# Patient Record
Sex: Female | Born: 1940 | Race: White | Hispanic: No | Marital: Married | Smoking: Former smoker
Health system: Southern US, Community
[De-identification: ages and names within clinical notes are randomized; demographics above are authoritative.]

## PROBLEM LIST (undated history)

## (undated) DIAGNOSIS — H269 Unspecified cataract: Secondary | ICD-10-CM

## (undated) DIAGNOSIS — M1990 Unspecified osteoarthritis, unspecified site: Secondary | ICD-10-CM

## (undated) DIAGNOSIS — K219 Gastro-esophageal reflux disease without esophagitis: Secondary | ICD-10-CM

## (undated) DIAGNOSIS — Z973 Presence of spectacles and contact lenses: Secondary | ICD-10-CM

## (undated) DIAGNOSIS — I219 Acute myocardial infarction, unspecified: Secondary | ICD-10-CM

## (undated) DIAGNOSIS — I10 Essential (primary) hypertension: Secondary | ICD-10-CM

## (undated) DIAGNOSIS — E119 Type 2 diabetes mellitus without complications: Secondary | ICD-10-CM

## (undated) DIAGNOSIS — R351 Nocturia: Secondary | ICD-10-CM

## (undated) DIAGNOSIS — C801 Malignant (primary) neoplasm, unspecified: Secondary | ICD-10-CM

## (undated) DIAGNOSIS — D649 Anemia, unspecified: Secondary | ICD-10-CM

## (undated) DIAGNOSIS — T8859XA Other complications of anesthesia, initial encounter: Secondary | ICD-10-CM

## (undated) DIAGNOSIS — T4145XA Adverse effect of unspecified anesthetic, initial encounter: Secondary | ICD-10-CM

## (undated) DIAGNOSIS — E7800 Pure hypercholesterolemia, unspecified: Secondary | ICD-10-CM

## (undated) DIAGNOSIS — Z972 Presence of dental prosthetic device (complete) (partial): Secondary | ICD-10-CM

## (undated) HISTORY — PX: ABDOMINAL HYSTERECTOMY: SHX81

## (undated) HISTORY — PX: TUBAL LIGATION: SHX77

## (undated) HISTORY — PX: MELANOMA EXCISION: SHX5266

## (undated) HISTORY — PX: DILATION AND CURETTAGE OF UTERUS: SHX78

## (undated) HISTORY — PX: TONSILLECTOMY: SUR1361

## (undated) HISTORY — PX: CORONARY STENT PLACEMENT: SHX1402

## (undated) HISTORY — PX: COLONOSCOPY: SHX174

## (undated) HISTORY — PX: MULTIPLE TOOTH EXTRACTIONS: SHX2053

## (undated) HISTORY — PX: APPENDECTOMY: SHX54

---

## 2014-04-11 DIAGNOSIS — Z85828 Personal history of other malignant neoplasm of skin: Secondary | ICD-10-CM | POA: Diagnosis not present

## 2014-04-11 DIAGNOSIS — L400 Psoriasis vulgaris: Secondary | ICD-10-CM | POA: Diagnosis not present

## 2014-04-12 DIAGNOSIS — I10 Essential (primary) hypertension: Secondary | ICD-10-CM | POA: Diagnosis not present

## 2014-04-12 DIAGNOSIS — M5416 Radiculopathy, lumbar region: Secondary | ICD-10-CM | POA: Diagnosis not present

## 2014-04-12 DIAGNOSIS — M4120 Other idiopathic scoliosis, site unspecified: Secondary | ICD-10-CM | POA: Diagnosis not present

## 2014-04-12 DIAGNOSIS — M5136 Other intervertebral disc degeneration, lumbar region: Secondary | ICD-10-CM | POA: Diagnosis not present

## 2014-04-12 DIAGNOSIS — M545 Low back pain: Secondary | ICD-10-CM | POA: Diagnosis not present

## 2014-04-12 DIAGNOSIS — Z6829 Body mass index (BMI) 29.0-29.9, adult: Secondary | ICD-10-CM | POA: Diagnosis not present

## 2014-04-14 DIAGNOSIS — Z1231 Encounter for screening mammogram for malignant neoplasm of breast: Secondary | ICD-10-CM | POA: Diagnosis not present

## 2014-04-15 DIAGNOSIS — M5136 Other intervertebral disc degeneration, lumbar region: Secondary | ICD-10-CM | POA: Diagnosis not present

## 2014-04-15 DIAGNOSIS — M4806 Spinal stenosis, lumbar region: Secondary | ICD-10-CM | POA: Diagnosis not present

## 2014-04-15 DIAGNOSIS — M5416 Radiculopathy, lumbar region: Secondary | ICD-10-CM | POA: Diagnosis not present

## 2014-04-15 DIAGNOSIS — M5126 Other intervertebral disc displacement, lumbar region: Secondary | ICD-10-CM | POA: Diagnosis not present

## 2014-04-26 DIAGNOSIS — M5416 Radiculopathy, lumbar region: Secondary | ICD-10-CM | POA: Diagnosis not present

## 2014-04-26 DIAGNOSIS — I10 Essential (primary) hypertension: Secondary | ICD-10-CM | POA: Diagnosis not present

## 2014-04-26 DIAGNOSIS — M4120 Other idiopathic scoliosis, site unspecified: Secondary | ICD-10-CM | POA: Diagnosis not present

## 2014-04-26 DIAGNOSIS — Z6830 Body mass index (BMI) 30.0-30.9, adult: Secondary | ICD-10-CM | POA: Diagnosis not present

## 2014-04-26 DIAGNOSIS — M5136 Other intervertebral disc degeneration, lumbar region: Secondary | ICD-10-CM | POA: Diagnosis not present

## 2014-04-26 DIAGNOSIS — M545 Low back pain: Secondary | ICD-10-CM | POA: Diagnosis not present

## 2014-05-04 DIAGNOSIS — M5416 Radiculopathy, lumbar region: Secondary | ICD-10-CM | POA: Diagnosis not present

## 2014-05-04 DIAGNOSIS — M5136 Other intervertebral disc degeneration, lumbar region: Secondary | ICD-10-CM | POA: Diagnosis not present

## 2014-05-04 DIAGNOSIS — M4120 Other idiopathic scoliosis, site unspecified: Secondary | ICD-10-CM | POA: Diagnosis not present

## 2014-05-10 DIAGNOSIS — L400 Psoriasis vulgaris: Secondary | ICD-10-CM | POA: Diagnosis not present

## 2014-05-10 DIAGNOSIS — Z85828 Personal history of other malignant neoplasm of skin: Secondary | ICD-10-CM | POA: Diagnosis not present

## 2014-05-17 DIAGNOSIS — Z955 Presence of coronary angioplasty implant and graft: Secondary | ICD-10-CM | POA: Diagnosis not present

## 2014-05-17 DIAGNOSIS — I2510 Atherosclerotic heart disease of native coronary artery without angina pectoris: Secondary | ICD-10-CM | POA: Diagnosis not present

## 2014-05-17 DIAGNOSIS — I10 Essential (primary) hypertension: Secondary | ICD-10-CM | POA: Diagnosis not present

## 2014-05-17 DIAGNOSIS — E785 Hyperlipidemia, unspecified: Secondary | ICD-10-CM | POA: Diagnosis not present

## 2014-06-01 DIAGNOSIS — M5416 Radiculopathy, lumbar region: Secondary | ICD-10-CM | POA: Diagnosis not present

## 2014-07-03 DIAGNOSIS — L570 Actinic keratosis: Secondary | ICD-10-CM | POA: Diagnosis not present

## 2014-07-03 DIAGNOSIS — D0372 Melanoma in situ of left lower limb, including hip: Secondary | ICD-10-CM | POA: Diagnosis not present

## 2014-07-03 DIAGNOSIS — Z85828 Personal history of other malignant neoplasm of skin: Secondary | ICD-10-CM | POA: Diagnosis not present

## 2014-07-03 DIAGNOSIS — D2372 Other benign neoplasm of skin of left lower limb, including hip: Secondary | ICD-10-CM | POA: Diagnosis not present

## 2014-07-03 DIAGNOSIS — L820 Inflamed seborrheic keratosis: Secondary | ICD-10-CM | POA: Diagnosis not present

## 2014-07-03 DIAGNOSIS — B078 Other viral warts: Secondary | ICD-10-CM | POA: Diagnosis not present

## 2014-07-03 DIAGNOSIS — L409 Psoriasis, unspecified: Secondary | ICD-10-CM | POA: Diagnosis not present

## 2014-08-01 DIAGNOSIS — E785 Hyperlipidemia, unspecified: Secondary | ICD-10-CM | POA: Diagnosis not present

## 2014-08-03 DIAGNOSIS — M545 Low back pain: Secondary | ICD-10-CM | POA: Diagnosis not present

## 2014-08-03 DIAGNOSIS — I10 Essential (primary) hypertension: Secondary | ICD-10-CM | POA: Diagnosis not present

## 2014-08-03 DIAGNOSIS — M5416 Radiculopathy, lumbar region: Secondary | ICD-10-CM | POA: Diagnosis not present

## 2014-08-03 DIAGNOSIS — M4120 Other idiopathic scoliosis, site unspecified: Secondary | ICD-10-CM | POA: Diagnosis not present

## 2014-08-03 DIAGNOSIS — Z6830 Body mass index (BMI) 30.0-30.9, adult: Secondary | ICD-10-CM | POA: Diagnosis not present

## 2014-08-03 DIAGNOSIS — M5136 Other intervertebral disc degeneration, lumbar region: Secondary | ICD-10-CM | POA: Diagnosis not present

## 2014-08-09 DIAGNOSIS — C4372 Malignant melanoma of left lower limb, including hip: Secondary | ICD-10-CM | POA: Diagnosis not present

## 2014-08-09 DIAGNOSIS — D0372 Melanoma in situ of left lower limb, including hip: Secondary | ICD-10-CM | POA: Diagnosis not present

## 2014-08-09 DIAGNOSIS — L905 Scar conditions and fibrosis of skin: Secondary | ICD-10-CM | POA: Diagnosis not present

## 2014-08-29 DIAGNOSIS — Z85820 Personal history of malignant melanoma of skin: Secondary | ICD-10-CM | POA: Diagnosis not present

## 2014-08-29 DIAGNOSIS — C439 Malignant melanoma of skin, unspecified: Secondary | ICD-10-CM | POA: Diagnosis not present

## 2014-10-03 DIAGNOSIS — Z85820 Personal history of malignant melanoma of skin: Secondary | ICD-10-CM | POA: Diagnosis not present

## 2014-10-03 DIAGNOSIS — B078 Other viral warts: Secondary | ICD-10-CM | POA: Diagnosis not present

## 2014-10-13 DIAGNOSIS — Z955 Presence of coronary angioplasty implant and graft: Secondary | ICD-10-CM | POA: Diagnosis not present

## 2014-10-13 DIAGNOSIS — Z79899 Other long term (current) drug therapy: Secondary | ICD-10-CM | POA: Diagnosis not present

## 2014-10-13 DIAGNOSIS — C439 Malignant melanoma of skin, unspecified: Secondary | ICD-10-CM | POA: Diagnosis not present

## 2014-10-13 DIAGNOSIS — N959 Unspecified menopausal and perimenopausal disorder: Secondary | ICD-10-CM | POA: Diagnosis not present

## 2014-10-13 DIAGNOSIS — Z23 Encounter for immunization: Secondary | ICD-10-CM | POA: Diagnosis not present

## 2014-10-13 DIAGNOSIS — I10 Essential (primary) hypertension: Secondary | ICD-10-CM | POA: Diagnosis not present

## 2014-11-21 DIAGNOSIS — M4120 Other idiopathic scoliosis, site unspecified: Secondary | ICD-10-CM | POA: Diagnosis not present

## 2014-11-21 DIAGNOSIS — M5416 Radiculopathy, lumbar region: Secondary | ICD-10-CM | POA: Diagnosis not present

## 2014-12-19 DIAGNOSIS — Z23 Encounter for immunization: Secondary | ICD-10-CM | POA: Diagnosis not present

## 2014-12-25 DIAGNOSIS — H35373 Puckering of macula, bilateral: Secondary | ICD-10-CM | POA: Diagnosis not present

## 2015-01-01 DIAGNOSIS — B078 Other viral warts: Secondary | ICD-10-CM | POA: Diagnosis not present

## 2015-01-01 DIAGNOSIS — L730 Acne keloid: Secondary | ICD-10-CM | POA: Diagnosis not present

## 2015-01-01 DIAGNOSIS — Z85820 Personal history of malignant melanoma of skin: Secondary | ICD-10-CM | POA: Diagnosis not present

## 2015-01-01 DIAGNOSIS — L570 Actinic keratosis: Secondary | ICD-10-CM | POA: Diagnosis not present

## 2015-01-09 DIAGNOSIS — M4120 Other idiopathic scoliosis, site unspecified: Secondary | ICD-10-CM | POA: Diagnosis not present

## 2015-01-09 DIAGNOSIS — M5416 Radiculopathy, lumbar region: Secondary | ICD-10-CM | POA: Diagnosis not present

## 2015-01-09 DIAGNOSIS — M545 Low back pain: Secondary | ICD-10-CM | POA: Diagnosis not present

## 2015-01-09 DIAGNOSIS — M5136 Other intervertebral disc degeneration, lumbar region: Secondary | ICD-10-CM | POA: Diagnosis not present

## 2015-01-16 DIAGNOSIS — Z955 Presence of coronary angioplasty implant and graft: Secondary | ICD-10-CM | POA: Diagnosis not present

## 2015-01-16 DIAGNOSIS — Z79899 Other long term (current) drug therapy: Secondary | ICD-10-CM | POA: Diagnosis not present

## 2015-01-16 DIAGNOSIS — D509 Iron deficiency anemia, unspecified: Secondary | ICD-10-CM | POA: Diagnosis not present

## 2015-01-16 DIAGNOSIS — I10 Essential (primary) hypertension: Secondary | ICD-10-CM | POA: Diagnosis not present

## 2015-01-16 DIAGNOSIS — E785 Hyperlipidemia, unspecified: Secondary | ICD-10-CM | POA: Diagnosis not present

## 2015-01-16 DIAGNOSIS — I2510 Atherosclerotic heart disease of native coronary artery without angina pectoris: Secondary | ICD-10-CM | POA: Diagnosis not present

## 2015-01-16 DIAGNOSIS — R8299 Other abnormal findings in urine: Secondary | ICD-10-CM | POA: Diagnosis not present

## 2015-01-31 DIAGNOSIS — R7301 Impaired fasting glucose: Secondary | ICD-10-CM | POA: Diagnosis not present

## 2015-03-22 DIAGNOSIS — M5416 Radiculopathy, lumbar region: Secondary | ICD-10-CM | POA: Diagnosis not present

## 2015-03-22 DIAGNOSIS — M4120 Other idiopathic scoliosis, site unspecified: Secondary | ICD-10-CM | POA: Diagnosis not present

## 2015-03-22 DIAGNOSIS — M5136 Other intervertebral disc degeneration, lumbar region: Secondary | ICD-10-CM | POA: Diagnosis not present

## 2015-03-23 DIAGNOSIS — Z955 Presence of coronary angioplasty implant and graft: Secondary | ICD-10-CM | POA: Diagnosis not present

## 2015-03-23 DIAGNOSIS — I2510 Atherosclerotic heart disease of native coronary artery without angina pectoris: Secondary | ICD-10-CM | POA: Diagnosis not present

## 2015-03-23 DIAGNOSIS — E785 Hyperlipidemia, unspecified: Secondary | ICD-10-CM | POA: Diagnosis not present

## 2015-03-23 DIAGNOSIS — I10 Essential (primary) hypertension: Secondary | ICD-10-CM | POA: Diagnosis not present

## 2015-03-23 DIAGNOSIS — Z79899 Other long term (current) drug therapy: Secondary | ICD-10-CM | POA: Diagnosis not present

## 2015-03-23 DIAGNOSIS — Z1239 Encounter for other screening for malignant neoplasm of breast: Secondary | ICD-10-CM | POA: Diagnosis not present

## 2015-03-29 DIAGNOSIS — D128 Benign neoplasm of rectum: Secondary | ICD-10-CM | POA: Diagnosis not present

## 2015-03-29 DIAGNOSIS — Z1283 Encounter for screening for malignant neoplasm of skin: Secondary | ICD-10-CM | POA: Diagnosis not present

## 2015-03-29 DIAGNOSIS — L98499 Non-pressure chronic ulcer of skin of other sites with unspecified severity: Secondary | ICD-10-CM | POA: Diagnosis not present

## 2015-03-29 DIAGNOSIS — Z85820 Personal history of malignant melanoma of skin: Secondary | ICD-10-CM | POA: Diagnosis not present

## 2015-04-09 DIAGNOSIS — D128 Benign neoplasm of rectum: Secondary | ICD-10-CM | POA: Diagnosis not present

## 2015-04-10 DIAGNOSIS — Z955 Presence of coronary angioplasty implant and graft: Secondary | ICD-10-CM | POA: Diagnosis not present

## 2015-04-10 DIAGNOSIS — E785 Hyperlipidemia, unspecified: Secondary | ICD-10-CM | POA: Diagnosis not present

## 2015-04-10 DIAGNOSIS — I2510 Atherosclerotic heart disease of native coronary artery without angina pectoris: Secondary | ICD-10-CM | POA: Diagnosis not present

## 2015-04-10 DIAGNOSIS — I10 Essential (primary) hypertension: Secondary | ICD-10-CM | POA: Diagnosis not present

## 2015-04-19 DIAGNOSIS — Z1231 Encounter for screening mammogram for malignant neoplasm of breast: Secondary | ICD-10-CM | POA: Diagnosis not present

## 2015-04-26 DIAGNOSIS — M5136 Other intervertebral disc degeneration, lumbar region: Secondary | ICD-10-CM | POA: Diagnosis not present

## 2015-04-26 DIAGNOSIS — M5416 Radiculopathy, lumbar region: Secondary | ICD-10-CM | POA: Diagnosis not present

## 2015-04-26 DIAGNOSIS — Z6830 Body mass index (BMI) 30.0-30.9, adult: Secondary | ICD-10-CM | POA: Diagnosis not present

## 2015-04-26 DIAGNOSIS — I10 Essential (primary) hypertension: Secondary | ICD-10-CM | POA: Diagnosis not present

## 2015-06-20 DIAGNOSIS — I10 Essential (primary) hypertension: Secondary | ICD-10-CM | POA: Diagnosis not present

## 2015-06-20 DIAGNOSIS — M5136 Other intervertebral disc degeneration, lumbar region: Secondary | ICD-10-CM | POA: Diagnosis not present

## 2015-06-20 DIAGNOSIS — M4120 Other idiopathic scoliosis, site unspecified: Secondary | ICD-10-CM | POA: Diagnosis not present

## 2015-06-20 DIAGNOSIS — M5416 Radiculopathy, lumbar region: Secondary | ICD-10-CM | POA: Diagnosis not present

## 2015-06-20 DIAGNOSIS — Z6831 Body mass index (BMI) 31.0-31.9, adult: Secondary | ICD-10-CM | POA: Diagnosis not present

## 2015-06-20 DIAGNOSIS — M545 Low back pain: Secondary | ICD-10-CM | POA: Diagnosis not present

## 2015-06-25 DIAGNOSIS — H35373 Puckering of macula, bilateral: Secondary | ICD-10-CM | POA: Diagnosis not present

## 2015-06-27 DIAGNOSIS — Z85820 Personal history of malignant melanoma of skin: Secondary | ICD-10-CM | POA: Diagnosis not present

## 2015-06-27 DIAGNOSIS — L409 Psoriasis, unspecified: Secondary | ICD-10-CM | POA: Diagnosis not present

## 2015-06-27 DIAGNOSIS — L570 Actinic keratosis: Secondary | ICD-10-CM | POA: Diagnosis not present

## 2015-06-27 DIAGNOSIS — L821 Other seborrheic keratosis: Secondary | ICD-10-CM | POA: Diagnosis not present

## 2015-06-27 DIAGNOSIS — Z1283 Encounter for screening for malignant neoplasm of skin: Secondary | ICD-10-CM | POA: Diagnosis not present

## 2015-06-27 DIAGNOSIS — L820 Inflamed seborrheic keratosis: Secondary | ICD-10-CM | POA: Diagnosis not present

## 2015-06-27 DIAGNOSIS — L720 Epidermal cyst: Secondary | ICD-10-CM | POA: Diagnosis not present

## 2015-07-11 DIAGNOSIS — F4542 Pain disorder with related psychological factors: Secondary | ICD-10-CM | POA: Diagnosis not present

## 2015-07-16 DIAGNOSIS — M545 Low back pain: Secondary | ICD-10-CM | POA: Diagnosis not present

## 2015-07-16 DIAGNOSIS — M4120 Other idiopathic scoliosis, site unspecified: Secondary | ICD-10-CM | POA: Diagnosis not present

## 2015-07-20 DIAGNOSIS — H35373 Puckering of macula, bilateral: Secondary | ICD-10-CM | POA: Diagnosis not present

## 2015-08-30 DIAGNOSIS — M545 Low back pain: Secondary | ICD-10-CM | POA: Diagnosis not present

## 2015-08-30 DIAGNOSIS — M4120 Other idiopathic scoliosis, site unspecified: Secondary | ICD-10-CM | POA: Diagnosis not present

## 2015-09-06 DIAGNOSIS — M5136 Other intervertebral disc degeneration, lumbar region: Secondary | ICD-10-CM | POA: Diagnosis not present

## 2015-09-06 DIAGNOSIS — M5416 Radiculopathy, lumbar region: Secondary | ICD-10-CM | POA: Diagnosis not present

## 2015-09-06 DIAGNOSIS — Z6831 Body mass index (BMI) 31.0-31.9, adult: Secondary | ICD-10-CM | POA: Diagnosis not present

## 2015-09-06 DIAGNOSIS — M545 Low back pain: Secondary | ICD-10-CM | POA: Diagnosis not present

## 2015-10-08 DIAGNOSIS — D2272 Melanocytic nevi of left lower limb, including hip: Secondary | ICD-10-CM | POA: Diagnosis not present

## 2015-10-08 DIAGNOSIS — C44599 Other specified malignant neoplasm of skin of other part of trunk: Secondary | ICD-10-CM | POA: Diagnosis not present

## 2015-10-08 DIAGNOSIS — L820 Inflamed seborrheic keratosis: Secondary | ICD-10-CM | POA: Diagnosis not present

## 2015-10-08 DIAGNOSIS — D485 Neoplasm of uncertain behavior of skin: Secondary | ICD-10-CM | POA: Diagnosis not present

## 2015-10-08 DIAGNOSIS — B078 Other viral warts: Secondary | ICD-10-CM | POA: Diagnosis not present

## 2015-10-08 DIAGNOSIS — C439 Malignant melanoma of skin, unspecified: Secondary | ICD-10-CM | POA: Diagnosis not present

## 2015-10-08 DIAGNOSIS — D220 Melanocytic nevi of lip: Secondary | ICD-10-CM | POA: Diagnosis not present

## 2015-10-08 DIAGNOSIS — Z85820 Personal history of malignant melanoma of skin: Secondary | ICD-10-CM | POA: Diagnosis not present

## 2015-10-08 DIAGNOSIS — R7301 Impaired fasting glucose: Secondary | ICD-10-CM | POA: Diagnosis not present

## 2015-10-08 DIAGNOSIS — Z1283 Encounter for screening for malignant neoplasm of skin: Secondary | ICD-10-CM | POA: Diagnosis not present

## 2015-10-16 ENCOUNTER — Other Ambulatory Visit: Payer: Self-pay | Admitting: Neurosurgery

## 2015-11-07 ENCOUNTER — Encounter (HOSPITAL_COMMUNITY)
Admission: RE | Admit: 2015-11-07 | Discharge: 2015-11-07 | Disposition: A | Discharge status: Home / Self Care | Payer: Medicare Other | Source: Ambulatory Visit | Attending: Neurosurgery | Admitting: Neurosurgery

## 2015-11-07 ENCOUNTER — Encounter (HOSPITAL_COMMUNITY): Payer: Self-pay | Admitting: *Deleted

## 2015-11-07 DIAGNOSIS — Z7902 Long term (current) use of antithrombotics/antiplatelets: Secondary | ICD-10-CM | POA: Insufficient documentation

## 2015-11-07 DIAGNOSIS — M545 Low back pain: Secondary | ICD-10-CM | POA: Insufficient documentation

## 2015-11-07 DIAGNOSIS — Z79899 Other long term (current) drug therapy: Secondary | ICD-10-CM | POA: Insufficient documentation

## 2015-11-07 DIAGNOSIS — Z01818 Encounter for other preprocedural examination: Secondary | ICD-10-CM | POA: Insufficient documentation

## 2015-11-07 DIAGNOSIS — Z7982 Long term (current) use of aspirin: Secondary | ICD-10-CM | POA: Diagnosis not present

## 2015-11-07 DIAGNOSIS — Z87891 Personal history of nicotine dependence: Secondary | ICD-10-CM | POA: Diagnosis not present

## 2015-11-07 DIAGNOSIS — Z01812 Encounter for preprocedural laboratory examination: Secondary | ICD-10-CM | POA: Diagnosis not present

## 2015-11-07 DIAGNOSIS — K219 Gastro-esophageal reflux disease without esophagitis: Secondary | ICD-10-CM | POA: Insufficient documentation

## 2015-11-07 DIAGNOSIS — E119 Type 2 diabetes mellitus without complications: Secondary | ICD-10-CM | POA: Insufficient documentation

## 2015-11-07 DIAGNOSIS — E7800 Pure hypercholesterolemia, unspecified: Secondary | ICD-10-CM | POA: Diagnosis not present

## 2015-11-07 DIAGNOSIS — Z955 Presence of coronary angioplasty implant and graft: Secondary | ICD-10-CM | POA: Insufficient documentation

## 2015-11-07 DIAGNOSIS — I252 Old myocardial infarction: Secondary | ICD-10-CM | POA: Insufficient documentation

## 2015-11-07 DIAGNOSIS — I2510 Atherosclerotic heart disease of native coronary artery without angina pectoris: Secondary | ICD-10-CM | POA: Insufficient documentation

## 2015-11-07 DIAGNOSIS — Z85820 Personal history of malignant melanoma of skin: Secondary | ICD-10-CM | POA: Diagnosis not present

## 2015-11-07 DIAGNOSIS — I10 Essential (primary) hypertension: Secondary | ICD-10-CM | POA: Insufficient documentation

## 2015-11-07 HISTORY — DX: Essential (primary) hypertension: I10

## 2015-11-07 HISTORY — DX: Unspecified cataract: H26.9

## 2015-11-07 HISTORY — DX: Presence of dental prosthetic device (complete) (partial): Z97.2

## 2015-11-07 HISTORY — DX: Other complications of anesthesia, initial encounter: T88.59XA

## 2015-11-07 HISTORY — DX: Nocturia: R35.1

## 2015-11-07 HISTORY — DX: Anemia, unspecified: D64.9

## 2015-11-07 HISTORY — DX: Type 2 diabetes mellitus without complications: E11.9

## 2015-11-07 HISTORY — DX: Presence of spectacles and contact lenses: Z97.3

## 2015-11-07 HISTORY — DX: Gastro-esophageal reflux disease without esophagitis: K21.9

## 2015-11-07 HISTORY — DX: Adverse effect of unspecified anesthetic, initial encounter: T41.45XA

## 2015-11-07 HISTORY — DX: Unspecified osteoarthritis, unspecified site: M19.90

## 2015-11-07 HISTORY — DX: Acute myocardial infarction, unspecified: I21.9

## 2015-11-07 HISTORY — DX: Malignant (primary) neoplasm, unspecified: C80.1

## 2015-11-07 HISTORY — DX: Pure hypercholesterolemia, unspecified: E78.00

## 2015-11-07 LAB — CBC
HCT: 40.6 % (ref 36.0–46.0)
Hemoglobin: 13.2 g/dL (ref 12.0–15.0)
MCH: 29.3 pg (ref 26.0–34.0)
MCHC: 32.5 g/dL (ref 30.0–36.0)
MCV: 90 fL (ref 78.0–100.0)
PLATELETS: 296 10*3/uL (ref 150–400)
RBC: 4.51 MIL/uL (ref 3.87–5.11)
RDW: 13.6 % (ref 11.5–15.5)
WBC: 8.7 10*3/uL (ref 4.0–10.5)

## 2015-11-07 LAB — BASIC METABOLIC PANEL
ANION GAP: 9 (ref 5–15)
BUN: 18 mg/dL (ref 6–20)
CALCIUM: 9.7 mg/dL (ref 8.9–10.3)
CO2: 27 mmol/L (ref 22–32)
Chloride: 103 mmol/L (ref 101–111)
Creatinine, Ser: 0.88 mg/dL (ref 0.44–1.00)
GFR calc Af Amer: >60 mL/min (ref >60)
GLUCOSE: 89 mg/dL (ref 65–99)
Potassium: 4 mmol/L (ref 3.5–5.1)
SODIUM: 139 mmol/L (ref 135–145)

## 2015-11-07 LAB — SURGICAL PCR SCREEN
MRSA, PCR: NEGATIVE
Staphylococcus aureus: NEGATIVE

## 2015-11-07 LAB — GLUCOSE, CAPILLARY: GLUCOSE-CAPILLARY: 112 mg/dL — AB (ref 65–99)

## 2015-11-07 NOTE — Pre-Procedure Instructions (Signed)
Ebony Foley  11/07/2015      [facility] [address] [place] [address] Phone: [phone] Fax: [phone]    Your procedure is scheduled on Tuesday, November 13, 2015  Report to [facility] Admitting at 10:15 A.M.  Call this number if you have problems the morning of surgery:  [phone]   Remember:  Do not eat food or drink liquids after midnight Monday, November 12, 2015  Take these medicines the morning of surgery with A SIP OF WATER : Amlodipine ( Norvasc), Metoprolol  ( Lopressor), Omeprazole ( Prilosec), Cetirizine ( Zyrtec) Stop taking Aspirin, Plavix, Vitamins, Krill oil,  and herbal medications ( CO Q 10). Do not take any NSAIDs ie: Ibuprofen, Advil, Naproxen or any medication containing Aspirin; stop now   Do not wear jewelry, make-up or nail polish.  Do not wear lotions, powders, or perfumes, or deoderant.  Do not shave 48 hours prior to surgery.    Do not bring valuables to the hospital.  [facility] is not responsible for any belongings or valuables.  Contacts, dentures or bridgework may not be worn into surgery.  Leave your suitcase in the car.  After surgery it may be brought to your room.  For patients admitted to the hospital, discharge time will be determined by your treatment team.  Patients discharged the day of surgery will not be allowed to drive home.   Name and phone number of your driver:   Special instructions:  [facility] - Preparing for Surgery  Before surgery, you can play an important role.  Because skin is not sterile, your skin needs to be as free of germs as possible.  You can reduce the number of germs on you skin by washing with CHG (chlorahexidine gluconate) soap before surgery.  CHG is an antiseptic cleaner which kills germs and bonds with the skin to continue killing germs even after washing.  Please DO NOT use if you have an allergy to CHG or  antibacterial soaps.  If your skin becomes reddened/irritated stop using the CHG and inform your nurse when you arrive at Short Stay.  Do not shave (including legs and underarms) for at least 48 hours prior to the first CHG shower.  You may shave your face.  Please follow these instructions carefully:   1.  Shower with CHG Soap the night before surgery and the morning of Surgery.  2.  If you choose to wash your hair, wash your hair first as usual with your normal shampoo.  3.  After you shampoo, rinse your hair and body thoroughly to remove the Shampoo.  4.  Use CHG as you would any other liquid soap.  You can apply chg directly  to the skin and wash gently with scrungie or a clean washcloth.  5.  Apply the CHG Soap to your body ONLY FROM THE NECK DOWN.  Do not use on open wounds or open sores.  Avoid contact with your eyes, ears, mouth and genitals (private parts).  Wash genitals (private parts) with your normal soap.  6.  Wash thoroughly, paying special attention to the area where your surgery will be performed.  7.  Thoroughly rinse your body with warm water from the neck down.  8.  DO NOT shower/wash with your normal soap after using and rinsing off the CHG Soap.  9.  Pat yourself dry with a clean towel.  10.  Wear clean pajamas.            11.  Place clean sheets on your bed the night of your first shower and do not sleep with pets.  Day of Surgery  Do not apply any lotions/deodorants the morning of surgery.  Please wear clean clothes to the hospital/surgery center.  Please read over the following fact sheets that you were given. Pain Booklet, Coughing and Deep Breathing, MRSA Information and Surgical Site Infection Prevention

## 2015-11-07 NOTE — Progress Notes (Signed)
Pt denies SOB and chest pain but is under the care of Dr. Ashby Dawes, Cardiology, of [facility]; requested echo   (2015), LOV notes and any cardiac studies. Requested A1c and LOV note from PCP Dr. Maryln Gottron, and chest x ray and LOV note  From  Dr. Bernadene Bell, Dermatology. Pt chart forwarded to anesthesia to review cardiac clearance note on chart. Pt stated that she was advised to stop taking Plavix, Aspirin and Krill Oil  and last dose was 11/06/15.

## 2015-11-09 NOTE — Progress Notes (Signed)
Anesthesia Chart Review: Patient is a 75 year old female scheduled for lumbar spinal cord stimulator insertion with paddle lead placement on 11/13/15 by Dr. Vertell Limber.  History includes former smoker, HTN, CAD/MI '09 s/p LAD and LCX stents, GERD, anemia, melanoma excision (left foot), DM2, hypercholesterolemia, hysterectomy, appendectomy, tonsillectomy.  PCP is Dr. Cathie Olden. Cardiologist is Dr. Lillia Corporal [facility]; see Care Everywhere), last visit 04/10/15 with one year follow-up recommended. He cleared patient for surgery with permission to hold Plavix one week prior surgery.   BP 130/72   Pulse 73   Temp 36.7 C   Resp 20   Ht 5\' 4"  (1.626 m)   Wt 177 lb 1.6 oz (80.3 kg)   SpO2 94%   BMI 30.40 kg/m    Meds includes amlodipine, aspirin, Lipitor, Plavix, mag oxide, Lopressor, Prilosec, krill oill. Patient reported being instructed to hold Plavix, ASA, krill oil since 11/06/15 (reviewed with anesthesiologist Dr. Conrad [place]).  11/07/15 EKG: NSR, minimal voltage criteria for LVH, may be normal variant.  07/12/13 Nuclear stress test [facility]; Care Everywhere): IMPRESSION: Normal study. EF 58%. No transient ischemic dilatation or diagnostic EKG changes.  09/13/11 Echo [facility]; Care Everywhere): SUMMARY:   1. Overall left ventricular ejection fraction is estimated at 60 to 65%.  2. Normal global left ventricular systolic function.  3. Impaired relaxation pattern of LV diastolic filling.  4. Mild concentric left ventricular hypertrophy.  5. Mild mitral annular calcification.  6. Atrial septal aneurysm.  7. No intracardiac thrombi, mass or vegetations.  Preoperative labs noted. A1c 724/17 6.7 (in Care Everywhere).  If no acute changes then I anticipate that she can proceed as planned.  [facility] Short Stay Center/Anesthesiology Phone [phone] 11/09/2015 2:48 PM

## 2015-11-12 MED ORDER — CEFAZOLIN SODIUM-DEXTROSE 2-4 GM/100ML-% IV SOLN
2.0000 g | INTRAVENOUS | Status: AC
  Administered 2015-11-13: 2 g via INTRAVENOUS
  Filled 2015-11-12: qty 100

## 2015-11-13 ENCOUNTER — Ambulatory Visit (HOSPITAL_COMMUNITY): Payer: Medicare Other | Admitting: Vascular Surgery

## 2015-11-13 ENCOUNTER — Observation Stay (HOSPITAL_COMMUNITY)
Admission: RE | Admit: 2015-11-13 | Discharge: 2015-11-13 | Disposition: A | Discharge status: Home / Self Care | Payer: Medicare Other | Source: Ambulatory Visit | Attending: Neurosurgery | Admitting: Neurosurgery

## 2015-11-13 ENCOUNTER — Encounter (HOSPITAL_COMMUNITY): Payer: Self-pay | Admitting: Surgery

## 2015-11-13 ENCOUNTER — Ambulatory Visit (HOSPITAL_COMMUNITY): Payer: Medicare Other | Admitting: Certified Registered Nurse Anesthetist

## 2015-11-13 ENCOUNTER — Ambulatory Visit (HOSPITAL_COMMUNITY): Payer: Medicare Other

## 2015-11-13 ENCOUNTER — Encounter (HOSPITAL_COMMUNITY)
Admission: RE | Disposition: A | Discharge status: Home / Self Care | Payer: Self-pay | Source: Ambulatory Visit | Attending: Neurosurgery

## 2015-11-13 DIAGNOSIS — M4120 Other idiopathic scoliosis, site unspecified: Secondary | ICD-10-CM | POA: Insufficient documentation

## 2015-11-13 DIAGNOSIS — Z01812 Encounter for preprocedural laboratory examination: Secondary | ICD-10-CM | POA: Insufficient documentation

## 2015-11-13 DIAGNOSIS — Z01818 Encounter for other preprocedural examination: Secondary | ICD-10-CM | POA: Insufficient documentation

## 2015-11-13 DIAGNOSIS — M1990 Unspecified osteoarthritis, unspecified site: Secondary | ICD-10-CM | POA: Diagnosis not present

## 2015-11-13 DIAGNOSIS — Z419 Encounter for procedure for purposes other than remedying health state, unspecified: Secondary | ICD-10-CM

## 2015-11-13 DIAGNOSIS — M545 Low back pain: Secondary | ICD-10-CM | POA: Diagnosis not present

## 2015-11-13 DIAGNOSIS — Z7982 Long term (current) use of aspirin: Secondary | ICD-10-CM | POA: Insufficient documentation

## 2015-11-13 DIAGNOSIS — Z969 Presence of functional implant, unspecified: Secondary | ICD-10-CM | POA: Diagnosis not present

## 2015-11-13 DIAGNOSIS — I10 Essential (primary) hypertension: Secondary | ICD-10-CM | POA: Diagnosis not present

## 2015-11-13 DIAGNOSIS — M5116 Intervertebral disc disorders with radiculopathy, lumbar region: Secondary | ICD-10-CM | POA: Diagnosis not present

## 2015-11-13 DIAGNOSIS — M4186 Other forms of scoliosis, lumbar region: Secondary | ICD-10-CM | POA: Diagnosis not present

## 2015-11-13 DIAGNOSIS — G8929 Other chronic pain: Secondary | ICD-10-CM | POA: Diagnosis not present

## 2015-11-13 DIAGNOSIS — M4126 Other idiopathic scoliosis, lumbar region: Secondary | ICD-10-CM | POA: Diagnosis not present

## 2015-11-13 DIAGNOSIS — Z87891 Personal history of nicotine dependence: Secondary | ICD-10-CM | POA: Diagnosis not present

## 2015-11-13 DIAGNOSIS — M5416 Radiculopathy, lumbar region: Secondary | ICD-10-CM | POA: Diagnosis not present

## 2015-11-13 HISTORY — PX: SPINAL CORD STIMULATOR INSERTION: SHX5378

## 2015-11-13 LAB — GLUCOSE, CAPILLARY
GLUCOSE-CAPILLARY: 108 mg/dL — AB (ref 65–99)
Glucose-Capillary: 133 mg/dL — ABNORMAL HIGH (ref 65–99)

## 2015-11-13 SURGERY — INSERTION, SPINAL CORD STIMULATOR, LUMBAR
Anesthesia: General | Site: Back

## 2015-11-13 MED ORDER — CEFAZOLIN IN D5W 1 GM/50ML IV SOLN: 1.0000 g | Freq: Three times a day (TID) | INTRAVENOUS | Status: DC

## 2015-11-13 MED ORDER — CALCIUM CARBONATE-VITAMIN D 500-200 MG-UNIT PO TABS: 2.0000 | ORAL_TABLET | Freq: Every day | ORAL | Status: DC

## 2015-11-13 MED ORDER — FENTANYL CITRATE (PF) 100 MCG/2ML IJ SOLN
INTRAMUSCULAR | Status: DC | PRN
  Administered 2015-11-13 (×4): 50 ug via INTRAVENOUS

## 2015-11-13 MED ORDER — METOPROLOL TARTRATE 25 MG PO TABS: 25.0000 mg | ORAL_TABLET | Freq: Two times a day (BID) | ORAL | Status: DC

## 2015-11-13 MED ORDER — MENTHOL 3 MG MT LOZG: 1.0000 | LOZENGE | OROMUCOSAL | Status: DC | PRN

## 2015-11-13 MED ORDER — MAGNESIUM OXIDE 400 (241.3 MG) MG PO TABS
400.0000 mg | ORAL_TABLET | Freq: Every day | ORAL | Status: DC
  Filled 2015-11-13: qty 1

## 2015-11-13 MED ORDER — FAMOTIDINE IN NACL 20-0.9 MG/50ML-% IV SOLN: 20.0000 mg | Freq: Two times a day (BID) | INTRAVENOUS | Status: DC

## 2015-11-13 MED ORDER — ROCURONIUM BROMIDE 10 MG/ML (PF) SYRINGE
PREFILLED_SYRINGE | INTRAVENOUS | Status: AC
  Filled 2015-11-13: qty 10

## 2015-11-13 MED ORDER — CHLORHEXIDINE GLUCONATE CLOTH 2 % EX PADS: 6.0000 | MEDICATED_PAD | Freq: Once | CUTANEOUS | Status: DC

## 2015-11-13 MED ORDER — DOCUSATE SODIUM 100 MG PO CAPS: 100.0000 mg | ORAL_CAPSULE | Freq: Two times a day (BID) | ORAL | Status: DC

## 2015-11-13 MED ORDER — HYDROMORPHONE HCL 1 MG/ML IJ SOLN: 0.5000 mg | INTRAMUSCULAR | Status: DC | PRN

## 2015-11-13 MED ORDER — ALUM & MAG HYDROXIDE-SIMETH 200-200-20 MG/5ML PO SUSP: 30.0000 mL | Freq: Four times a day (QID) | ORAL | Status: DC | PRN

## 2015-11-13 MED ORDER — FENTANYL CITRATE (PF) 100 MCG/2ML IJ SOLN
INTRAMUSCULAR | Status: AC
  Filled 2015-11-13: qty 4

## 2015-11-13 MED ORDER — POLYVINYL ALCOHOL 1.4 % OP SOLN
1.0000 [drp] | OPHTHALMIC | Status: DC
  Filled 2015-11-13: qty 15

## 2015-11-13 MED ORDER — SODIUM CHLORIDE 0.9 % IV SOLN: 250.0000 mL | INTRAVENOUS | Status: DC

## 2015-11-13 MED ORDER — FLEET ENEMA 7-19 GM/118ML RE ENEM: 1.0000 | ENEMA | Freq: Once | RECTAL | Status: DC | PRN

## 2015-11-13 MED ORDER — SODIUM CHLORIDE 0.9% FLUSH: 3.0000 mL | INTRAVENOUS | Status: DC | PRN

## 2015-11-13 MED ORDER — METHOCARBAMOL 1000 MG/10ML IJ SOLN
500.0000 mg | Freq: Four times a day (QID) | INTRAVENOUS | Status: DC | PRN
  Filled 2015-11-13: qty 5

## 2015-11-13 MED ORDER — OXYCODONE HCL 5 MG/5ML PO SOLN: 5.0000 mg | Freq: Once | ORAL | Status: DC | PRN

## 2015-11-13 MED ORDER — SUGAMMADEX SODIUM 200 MG/2ML IV SOLN
INTRAVENOUS | Status: DC | PRN
  Administered 2015-11-13: 200 mg via INTRAVENOUS

## 2015-11-13 MED ORDER — LIDOCAINE-EPINEPHRINE 1 %-1:100000 IJ SOLN
INTRAMUSCULAR | Status: DC | PRN
  Administered 2015-11-13: 7 mL

## 2015-11-13 MED ORDER — LORATADINE 10 MG PO TABS
10.0000 mg | ORAL_TABLET | Freq: Every day | ORAL | Status: DC
Start: 2015-11-13 — End: 2015-11-13

## 2015-11-13 MED ORDER — BISACODYL 10 MG RE SUPP: 10.0000 mg | Freq: Every day | RECTAL | Status: DC | PRN

## 2015-11-13 MED ORDER — OXYCODONE HCL 5 MG PO TABS: 5.0000 mg | ORAL_TABLET | Freq: Once | ORAL | Status: DC | PRN

## 2015-11-13 MED ORDER — BUPIVACAINE HCL (PF) 0.5 % IJ SOLN
INTRAMUSCULAR | Status: DC | PRN
  Administered 2015-11-13: 7 mL

## 2015-11-13 MED ORDER — ATORVASTATIN CALCIUM 20 MG PO TABS: 20.0000 mg | ORAL_TABLET | Freq: Every day | ORAL | Status: DC

## 2015-11-13 MED ORDER — EPHEDRINE SULFATE 50 MG/ML IJ SOLN
INTRAMUSCULAR | Status: DC | PRN
  Administered 2015-11-13 (×2): 10 mg via INTRAVENOUS
  Administered 2015-11-13: 20 mg via INTRAVENOUS
  Administered 2015-11-13: 10 mg via INTRAVENOUS

## 2015-11-13 MED ORDER — PHENOL 1.4 % MT LIQD: 1.0000 | OROMUCOSAL | Status: DC | PRN

## 2015-11-13 MED ORDER — LACTATED RINGERS IV SOLN
INTRAVENOUS | Status: DC
  Administered 2015-11-13 (×3): via INTRAVENOUS

## 2015-11-13 MED ORDER — METHOCARBAMOL 500 MG PO TABS: 500.0000 mg | ORAL_TABLET | Freq: Four times a day (QID) | ORAL | Status: DC | PRN

## 2015-11-13 MED ORDER — KRILL OIL 500 MG PO CAPS: 1.0000 | ORAL_CAPSULE | Freq: Every day | ORAL | Status: DC

## 2015-11-13 MED ORDER — PANTOPRAZOLE SODIUM 40 MG PO TBEC
40.0000 mg | DELAYED_RELEASE_TABLET | Freq: Every day | ORAL | Status: DC
Start: 2015-11-13 — End: 2015-11-13

## 2015-11-13 MED ORDER — ZOLPIDEM TARTRATE 5 MG PO TABS: 5.0000 mg | ORAL_TABLET | Freq: Every evening | ORAL | Status: DC | PRN

## 2015-11-13 MED ORDER — HEMOSTATIC AGENTS (NO CHARGE) OPTIME
TOPICAL | Status: DC | PRN
  Administered 2015-11-13: 1 via TOPICAL

## 2015-11-13 MED ORDER — VANCOMYCIN HCL 1000 MG IV SOLR
INTRAVENOUS | Status: AC
  Filled 2015-11-13: qty 1000

## 2015-11-13 MED ORDER — LIDOCAINE 2% (20 MG/ML) 5 ML SYRINGE
INTRAMUSCULAR | Status: DC | PRN
  Administered 2015-11-13: 40 mg via INTRAVENOUS

## 2015-11-13 MED ORDER — PHENYLEPHRINE 40 MCG/ML (10ML) SYRINGE FOR IV PUSH (FOR BLOOD PRESSURE SUPPORT)
PREFILLED_SYRINGE | INTRAVENOUS | Status: AC
  Filled 2015-11-13: qty 10

## 2015-11-13 MED ORDER — LIDOCAINE 2% (20 MG/ML) 5 ML SYRINGE
INTRAMUSCULAR | Status: AC
  Filled 2015-11-13: qty 5

## 2015-11-13 MED ORDER — FENTANYL CITRATE (PF) 100 MCG/2ML IJ SOLN: 25.0000 ug | INTRAMUSCULAR | Status: DC | PRN

## 2015-11-13 MED ORDER — POLYETHYL GLYCOL-PROPYL GLYCOL 0.4-0.3 % OP GEL
OPHTHALMIC | Status: DC
  Filled 2015-11-13: qty 10

## 2015-11-13 MED ORDER — KCL IN DEXTROSE-NACL 20-5-0.45 MEQ/L-%-% IV SOLN: INTRAVENOUS | Status: DC

## 2015-11-13 MED ORDER — ONDANSETRON HCL 4 MG/2ML IJ SOLN
INTRAMUSCULAR | Status: DC | PRN
  Administered 2015-11-13: 4 mg via INTRAVENOUS

## 2015-11-13 MED ORDER — PROPOFOL 10 MG/ML IV BOLUS
INTRAVENOUS | Status: AC
  Filled 2015-11-13: qty 20

## 2015-11-13 MED ORDER — 0.9 % SODIUM CHLORIDE (POUR BTL) OPTIME
TOPICAL | Status: DC | PRN
  Administered 2015-11-13: 1000 mL

## 2015-11-13 MED ORDER — ADULT MULTIVITAMIN W/MINERALS CH: 1.0000 | ORAL_TABLET | Freq: Every day | ORAL | Status: DC

## 2015-11-13 MED ORDER — PROPOFOL 10 MG/ML IV BOLUS
INTRAVENOUS | Status: DC | PRN
  Administered 2015-11-13: 20 mg via INTRAVENOUS
  Administered 2015-11-13: 10 mg via INTRAVENOUS
  Administered 2015-11-13: 130 mg via INTRAVENOUS

## 2015-11-13 MED ORDER — ACETAMINOPHEN 325 MG PO TABS: 650.0000 mg | ORAL_TABLET | ORAL | Status: DC | PRN

## 2015-11-13 MED ORDER — CO Q 10 100 MG PO CAPS: 100.0000 mg | ORAL_CAPSULE | Freq: Every day | ORAL | Status: DC

## 2015-11-13 MED ORDER — SUGAMMADEX SODIUM 200 MG/2ML IV SOLN
INTRAVENOUS | Status: AC
  Filled 2015-11-13: qty 2

## 2015-11-13 MED ORDER — ONDANSETRON HCL 4 MG/2ML IJ SOLN
INTRAMUSCULAR | Status: AC
  Filled 2015-11-13: qty 2

## 2015-11-13 MED ORDER — EPHEDRINE 5 MG/ML INJ
INTRAVENOUS | Status: AC
  Filled 2015-11-13: qty 10

## 2015-11-13 MED ORDER — SODIUM CHLORIDE 0.9% FLUSH: 3.0000 mL | Freq: Two times a day (BID) | INTRAVENOUS | Status: DC

## 2015-11-13 MED ORDER — SENNOSIDES-DOCUSATE SODIUM 8.6-50 MG PO TABS: 1.0000 | ORAL_TABLET | Freq: Every evening | ORAL | Status: DC | PRN

## 2015-11-13 MED ORDER — ONDANSETRON HCL 4 MG/2ML IJ SOLN
4.0000 mg | INTRAMUSCULAR | Status: DC | PRN
  Administered 2015-11-13: 4 mg via INTRAVENOUS

## 2015-11-13 MED ORDER — OXYCODONE-ACETAMINOPHEN 5-325 MG PO TABS: 1.0000 | ORAL_TABLET | ORAL | Status: DC | PRN

## 2015-11-13 MED ORDER — ROCURONIUM BROMIDE 10 MG/ML (PF) SYRINGE
PREFILLED_SYRINGE | INTRAVENOUS | Status: DC | PRN
  Administered 2015-11-13: 40 mg via INTRAVENOUS

## 2015-11-13 MED ORDER — AMLODIPINE BESYLATE 10 MG PO TABS
10.0000 mg | ORAL_TABLET | Freq: Every day | ORAL | Status: DC
  Filled 2015-11-13: qty 1

## 2015-11-13 MED ORDER — PHENYLEPHRINE HCL 10 MG/ML IJ SOLN
INTRAMUSCULAR | Status: DC | PRN
  Administered 2015-11-13 (×2): 80 ug via INTRAVENOUS

## 2015-11-13 MED ORDER — ONDANSETRON HCL 4 MG/2ML IJ SOLN: 4.0000 mg | Freq: Once | INTRAMUSCULAR | Status: DC | PRN

## 2015-11-13 MED ORDER — HYDROCODONE-ACETAMINOPHEN 5-325 MG PO TABS: 1.0000 | ORAL_TABLET | ORAL | Status: DC | PRN

## 2015-11-13 MED ORDER — ACETAMINOPHEN 650 MG RE SUPP: 650.0000 mg | RECTAL | Status: DC | PRN

## 2015-11-13 MED ORDER — ASPIRIN 81 MG PO CHEW: 81.0000 mg | CHEWABLE_TABLET | Freq: Every day | ORAL | Status: DC

## 2015-11-13 MED ORDER — THROMBIN 5000 UNITS EX SOLR
CUTANEOUS | Status: DC | PRN
  Administered 2015-11-13 (×2): 5000 [IU] via TOPICAL

## 2015-11-13 SURGICAL SUPPLY — 63 items
BLADE CLIPPER SURG (BLADE) IMPLANT
BUR MATCHSTICK NEURO 3.0 LAGG (BURR) IMPLANT
BUR PRECISION FLUTE 5.0 (BURR) ×3 IMPLANT
DECANTER SPIKE VIAL GLASS SM (MISCELLANEOUS) ×3 IMPLANT
DERMABOND ADVANCED (GAUZE/BANDAGES/DRESSINGS) ×4
DERMABOND ADVANCED .7 DNX12 (GAUZE/BANDAGES/DRESSINGS) ×2 IMPLANT
DRAPE C-ARM 42X72 X-RAY (DRAPES) ×3 IMPLANT
DRAPE LAPAROTOMY 100X72X124 (DRAPES) ×3 IMPLANT
DRAPE POUCH INSTRU U-SHP 10X18 (DRAPES) ×3 IMPLANT
DRAPE SURG 17X23 STRL (DRAPES) ×3 IMPLANT
DRSG OPSITE POSTOP 4X6 (GAUZE/BANDAGES/DRESSINGS) ×6 IMPLANT
ELECT REM PT RETURN 9FT ADLT (ELECTROSURGICAL) ×3
ELECTRODE REM PT RTRN 9FT ADLT (ELECTROSURGICAL) ×1 IMPLANT
ELEVATER PASSER (SPINAL CORD STIMULATOR) ×3
GAUZE SPONGE 4X4 16PLY XRAY LF (GAUZE/BANDAGES/DRESSINGS) IMPLANT
GLOVE BIO SURGEON STRL SZ8 (GLOVE) ×3 IMPLANT
GLOVE BIOGEL PI IND STRL 7.0 (GLOVE) ×1 IMPLANT
GLOVE BIOGEL PI IND STRL 7.5 (GLOVE) ×2 IMPLANT
GLOVE BIOGEL PI IND STRL 8 (GLOVE) ×3 IMPLANT
GLOVE BIOGEL PI IND STRL 8.5 (GLOVE) ×1 IMPLANT
GLOVE BIOGEL PI INDICATOR 7.0 (GLOVE) ×2
GLOVE BIOGEL PI INDICATOR 7.5 (GLOVE) ×4
GLOVE BIOGEL PI INDICATOR 8 (GLOVE) ×6
GLOVE BIOGEL PI INDICATOR 8.5 (GLOVE) ×2
GLOVE ECLIPSE 7.5 STRL STRAW (GLOVE) ×9 IMPLANT
GLOVE EXAM NITRILE LRG STRL (GLOVE) IMPLANT
GLOVE EXAM NITRILE XL STR (GLOVE) IMPLANT
GLOVE EXAM NITRILE XS STR PU (GLOVE) IMPLANT
GLOVE SS BIOGEL STRL SZ 7 (GLOVE) ×1 IMPLANT
GLOVE SUPERSENSE BIOGEL SZ 7 (GLOVE) ×2
GLOVE SURG SS PI 7.0 STRL IVOR (GLOVE) ×6 IMPLANT
GOWN STRL REUS W/ TWL LRG LVL3 (GOWN DISPOSABLE) ×1 IMPLANT
GOWN STRL REUS W/ TWL XL LVL3 (GOWN DISPOSABLE) ×2 IMPLANT
GOWN STRL REUS W/TWL 2XL LVL3 (GOWN DISPOSABLE) ×6 IMPLANT
GOWN STRL REUS W/TWL LRG LVL3 (GOWN DISPOSABLE) ×2
GOWN STRL REUS W/TWL XL LVL3 (GOWN DISPOSABLE) ×4
IPG PRECISION SPECTRA (Stimulator) ×3 IMPLANT
KIT BASIN OR (CUSTOM PROCEDURE TRAY) ×3 IMPLANT
KIT CHARGING (KITS) ×2
KIT CHARGING PRECISION NEURO (KITS) ×1 IMPLANT
KIT PAT PROGRAM FREELINK (KITS) ×1 IMPLANT
KIT ROOM TURNOVER OR (KITS) ×3 IMPLANT
LEAD ARTISAN 50CM (Spinal Cord Stimulator) ×3 IMPLANT
MARKER SKIN DUAL TIP RULER LAB (MISCELLANEOUS) ×3 IMPLANT
NEEDLE HYPO 25X1 1.5 SAFETY (NEEDLE) ×3 IMPLANT
NS IRRIG 1000ML POUR BTL (IV SOLUTION) ×3 IMPLANT
PACK LAMINECTOMY NEURO (CUSTOM PROCEDURE TRAY) ×3 IMPLANT
PAD ARMBOARD 7.5X6 YLW CONV (MISCELLANEOUS) ×9 IMPLANT
PASSER ELEVATOR (SPINAL CORD STIMULATOR) ×1 IMPLANT
REMOTE CONTROL KIT (KITS) ×3
SPONGE LAP 4X18 X RAY DECT (DISPOSABLE) IMPLANT
SPONGE SURGIFOAM ABS GEL SZ50 (HEMOSTASIS) ×3 IMPLANT
STAPLER SKIN PROX WIDE 3.9 (STAPLE) ×3 IMPLANT
SUT SILK 0 TIES 10X30 (SUTURE) IMPLANT
SUT SILK 2 0 FS (SUTURE) ×9 IMPLANT
SUT SILK 2 0 TIES 10X30 (SUTURE) ×3 IMPLANT
SUT VIC AB 0 CT1 18XCR BRD8 (SUTURE) ×1 IMPLANT
SUT VIC AB 0 CT1 8-18 (SUTURE) ×2
SUT VIC AB 2-0 CP2 18 (SUTURE) ×3 IMPLANT
SUT VIC AB 3-0 SH 8-18 (SUTURE) ×6 IMPLANT
TOWEL OR 17X24 6PK STRL BLUE (TOWEL DISPOSABLE) ×3 IMPLANT
TOWEL OR 17X26 10 PK STRL BLUE (TOWEL DISPOSABLE) ×3 IMPLANT
WATER STERILE IRR 1000ML POUR (IV SOLUTION) ×3 IMPLANT

## 2015-11-13 NOTE — H&P (Signed)
Patient ID:   [id number] Patient: Ebony Foley  Date of Birth: [birth year] Visit Type: Office Visit   Date: 09/06/2015 02:45 PM Provider: Leanora Ivanoff  Historian: self  This 75 year old female presents for back pain.  History of Present Illness: 1.  back pain    Ebony Foley is a very pleasant female who returns to our clinic today after last being seen on August 30, 2015 at which time a spinal cord stimulator trial was initiated for the patient's chronic low back pain.  The patient has trialed injection therapy and has received variable relief.  She does not like to utilize any type of medication therapy and therefore opted to proceed with the spinal cord stimulator.  She was previously referred to [place] by Dr. Vertell Limber.  Today the patient returns stating that the trial went quite well.  She reports 100% improvement in her symptoms until last night.  Suddenly the sensation changed last night.  She reports no falls or traumas.  It is likely that the lead migrated.  During the earlier part of her trial she did very well.  She states she was able to sleep and walk better.  She reports getting coverage everywhere that she needed it.  She reports no side effects or problems with it.  She rates her pain a 4 out of 10 today.      Medical/Surgical/Interim History Reviewed, no change.  Last detailed document date:04/26/2014.   PAST MEDICAL HISTORY, SURGICAL HISTORY, FAMILY HISTORY, SOCIAL HISTORY AND REVIEW OF SYSTEMS  01/09/2015, which I have signed.  Family History: Reviewed, no changes.  Last detailed document: 04/26/2014.  Patient reports there is no relevant family history.   Social History: Tobacco use reviewed. Reviewed, no changes. Last detailed document date: 04/26/2014.      MEDICATIONS(added, continued or stopped this visit): Started Medication Directions Instruction Stopped   alpha lipoic acid 200 mg tablet Take 1 tablet daily     amlodipine 5 mg tablet take 1 tablet by oral  route  every day     aspirin 81 mg tablet,delayed release take 1 tablet by oral route  every day     atorvastatin 20 mg tablet take 1 tablet by oral route  every day     Centrum Silver Take once a day     clopidogrel 75 mg tablet take 1 tablet by oral route  every day     coenzyme q-10 (ubiquinol) 100 mg capsule Take 1 capsule daily    08/30/2015 Keflex 500 mg capsule take 1 capsule by oral route  every 8 hours  09/06/2015   krill oil Take once a day     magnesium oxide 400 mg tablet Take 1 tablet daily     metoprolol tartrate 25 mg tablet take 1 tablet by oral route 2 times every day     niacin 500 mg tablet take 1 tablet by oral route  every day     omeprazole 20 mg tablet,delayed release take 1 capsule by oral route  every day     Vitamin B-12 2,500 mcg sublingual tablet Take 1 tablet daily     Vitamin B-6 100 mg tablet Take 1 tablet daily     Zyrtec 10 mg tablet take 1 tablet by oral route  every day as needed       ALLERGIES: Ingredient Reaction Medication Name Comment  NO KNOWN ALLERGIES     No known allergies. Reviewed, no changes.   REVIEW OF SYSTEMS System Neg/Pos  Details  Constitutional Negative Chills, fatigue, fever, malaise, night sweats, weight gain and weight loss.  ENMT Negative Ear drainage, hearing loss, nasal drainage, otalgia, sinus pressure and sore throat.  Eyes Negative Eye discharge, eye pain and vision changes.  Respiratory Negative Chronic cough, cough, dyspnea, known TB exposure and wheezing.  Cardio Negative Chest pain, claudication, edema and irregular heartbeat/palpitations.  GI Negative Abdominal pain, blood in stool, change in stool pattern, constipation, decreased appetite, diarrhea, heartburn, nausea and vomiting.  GU Negative Dysuria, hematuria, hot flashes, irregular menses, polyuria, urinary frequency, urinary incontinence and urinary retention.  Endocrine Negative Cold intolerance, heat intolerance, polydipsia and polyphagia.  Neuro Negative  Dizziness, extremity weakness, gait disturbance, headache, memory impairment, numbness in extremity, seizures and tremors.  Psych Negative Anxiety, depression and insomnia.  Integumentary Negative Brittle hair, brittle nails, change in shape/size of mole(s), hair loss, hirsutism, hives, pruritus, rash and skin lesion.  MS Positive Back pain.  MS Negative Joint pain, joint swelling, muscle weakness and neck pain.  Hema/Lymph Negative Easy bleeding, easy bruising and lymphadenopathy.  Allergic/Immuno Negative Contact allergy, environmental allergies, food allergies and seasonal allergies.  Reproductive Negative Breast discharge, breast lump(s), dysmenorrhea, dyspareunia, history of abnormal PAP smear and vaginal discharge.     Vitals Date Temp F BP Pulse Ht In Wt Lb BMI BSA Pain Score  09/06/2015  138/79 66 63 177 31.35  4/10     PHYSICAL EXAM General General Appearance: normal Mood/Affect: normal Orientation: normal Pulses/Edema: normal Gait/Station: normal, normal toe and heel walk Coordination: normal Respiratory: non-labored during exam Pupils: equal, anicteric Level of Distress: no acute distress Overall Appearance: Normal  Cardiovascular Cardiac: normal  Respiratory Lungs: normal  Neurological Orientation: normal Recent and Remote Memory: normal Attention Span and Concentration:   normal Language: normal Fund of Knowledge: normal  Stability Lumbar Spine: motion is with pain  Range of Motion Lumbar Spine: all mild, decreased range of motion   Cranial Nerves II. Optic Nerve/Visual Fields: normal III. Oculomotor: normal IV. Trochlear: normal V. Trigeminal: normal VI. Abducens: normal VII. Facial: normal VIII. Acoustic/Vestibular: normal IX. Glossopharyngeal: normal X. Vagus: normal XI. Spinal Accessory: normal XII. Hypoglossal: normal      IMPRESSION Chronic low back pain  Patient is on an anti-coagulant, anti-inflammatory or supplement that may  increase bleeding time. Patient advised to stop medicine prior to surgery.  Completed Orders (this encounter) Order Details Reason Side Interpretation Result Initial Treatment Date Region  Lifestyle education regarding diet Continue to eat a heart, healthy diet.         Assessment/Plan # Detail Type Description   1. Assessment Low back pain, unspecified back pain laterality, with sciatica presence unspecified (M54.5).       2. Assessment DDD (degenerative disc disease), lumbar (M51.36).       3. Assessment Radiculopathy, lumbar region (M54.16).       4. Assessment Body mass index (BMI) 31.0-31.9, adult (Z68.31).   Plan Orders Today's instructions / counseling include(s) Lifestyle education regarding diet.         Pain Assessment/Treatment Pain Scale: 4/10. Method: Numeric Pain Intensity Scale. Location: back. Onset: 12/11/2013. Duration: varies. Quality: aching, dull. Pain Assessment/Treatment follow-up plan of care: Continue to take medication as prescribed..  Fall Risk Plan The patient has not fallen in the last year.  Removed the patients temporary leads with no issue.  There was no bleeding at the site and the patient tolerated the process very well.  The insertion site showed no signs of infection.  The  patient did not report any pain during the process.  I cleansed the surrounding area with alcohol.  The patient was discharged stable and ambulator.  We will call to schedule the placement of the permanent SCS.  The patient had some difficult anatomy so will be receiving a paddle lead from Dr. Vertell Limber.  Her mother just had a stroke.  She may have to hold off on scheduling the permanent placement however she will talk with our surgery schedulers about this.   Orders: Instruction(s)/Education: Assessment Instruction  Z68.31 Lifestyle education regarding diet             Provider:  Leanora Ivanoff  09/06/2015 03:18 PM  Under the supervision of Bonna Gains PhD  MD Dictation edited by: Leanora Ivanoff [facility]    CC Providers: Erline Levine MD [address] [place], [place] [address]              Electronically signed by Leanora Ivanoff on 09/06/2015 03:18 PM  Patient ID:   [id number] Patient: Ebony Foley  Date of Birth: 02-Feb-1941 Visit Type: Office Visit   Date: 04/26/2014 03:00 PM Provider: Marchia Meiers. Vertell Limber MD   This 75 year old female presents for back pain.  History of Present Illness: 1.  back pain  Patient visits to review her MRI.  I met with the patient and her husband today and then reviewed her MRIs with him in detail and then presented her situation to Dr. Clydell Hakim.  At this point I believe her main complaint relates to right L2 and right L3 nerve root compression.  I recommended that we try right L2 and right L3 nerve blocks to see if this will relieve her discomfort.  I do not recommend a large reconstructive spinal surgery.  She has severe spinal stenosis and scoliosis.        PAST MEDICAL/SURGICAL HISTORY   (Detailed)  Disease/disorder Onset Date Management Date Comments  High cholesterol      Hypertension       DIAGNOSTICS HISTORY: Test Ordered Ordering Comments Modifier  Lumbar Spine- AP/Lat/Flex/Ex 04/12/2014      PAST MEDICAL HISTORY, SURGICAL HISTORY, FAMILY HISTORY, SOCIAL HISTORY AND REVIEW OF SYSTEMS I have reviewed the patient's past medical, surgical, family and social history as well as the comprehensive review of systems as included on the [facility] history form dated, which I have signed.  Family History  (Detailed)   SOCIAL HISTORY  (Detailed) Tobacco use reviewed. Preferred language is Unknown.   Smoking status: Former smoker.  SMOKING STATUS Use Status Type Smoking Status Usage Per Day Years Used Total Pack Years  yes  Former smoker       HOME ENVIRONMENT/SAFETY The patient has not fallen in the last year.        MEDICATIONS(added,  continued or stopped this visit): Started Medication Directions Instruction Stopped   alpha lipoic acid 200 mg tablet Take 1 tablet daily     amlodipine 5 mg tablet take 1 tablet by oral route  every day     aspirin 81 mg tablet,delayed release take 1 tablet by oral route  every day     atorvastatin 20 mg tablet take 1 tablet by oral route  every day     Centrum Silver Take once a day     clopidogrel 75 mg tablet take 1 tablet by oral route  every day     coenzyme q-10 (ubiquinol) 100 mg capsule Take 1 capsule daily  krill oil Take once a day     magnesium oxide 400 mg tablet Take 1 tablet daily     metoprolol tartrate 25 mg tablet take 1 tablet by oral route 2 times every day     niacin 500 mg tablet take 1 tablet by oral route  every day     omeprazole 20 mg tablet,delayed release take 1 capsule by oral route  every day     Vitamin B-12 2,500 mcg sublingual tablet Take 1 tablet daily     Vitamin B-6 100 mg tablet Take 1 tablet daily     Zyrtec 10 mg tablet take 1 tablet by oral route  every day as needed       ALLERGIES: Ingredient Reaction Medication Name Comment  NO KNOWN ALLERGIES     No known allergies. Reviewed, no changes.    Vitals Date Temp F BP Pulse Ht In Wt Lb BMI BSA Pain Score  04/26/2014  154/76 73 63 170 30.11  8/10     DIAGNOSTIC RESULTS Diagnostic report text  CLINICAL DATA: Intermittent low back pain over the last year. Right hip and leg pain with weakness, worsening over the last 2 months.  EXAM: MRI LUMBAR SPINE WITHOUT CONTRAST  TECHNIQUE: Multiplanar, multisequence MR imaging of the lumbar spine was performed. No intravenous contrast was administered.  COMPARISON: MRI from [facility] dated 04/22/2012  FINDINGS: There is curvature convex to the left with the apex at L2-3.  T11-12 was not studied in detail, but there appears to be a shallow broad-based disc herniation in combination with facet degeneration and hypertrophy which causes  spinal stenosis and could have some compressive effect upon the distal cord.  T12-L1: Not studied in detail. Endplate osteophytes and bulging of the disc. No apparent compressive effect upon the cord.  L1-2: Disc degeneration with endplate osteophytes and bulging of the disc more prominent on the right. Mild stenosis of the right lateral recess and intervertebral foramen without definite neural compression.  L2-3: Disc degeneration with endplate osteophytes and bulging of the disc. Mild facet hypertrophy. Mild stenosis of both lateral recesses. Moderate narrowing of the foramen on the right and mild narrowing of the foramen on the left. The conus tip is low lying, at this level. There would be some potential to affect the exiting right L2 nerve root.  L3-4: Disc degeneration with endplate osteophytes and bulging of the disc. Facet and ligamentous hypertrophy bilaterally. Stenosis of both lateral recesses and neural foramina, right more than left. There would be potential for compression of the exiting L3 nerve root.  L4-5: Disc degeneration with bulging of the disc, worse towards the left. Bilateral facet degeneration and hypertrophy left more than right. Stenosis of both lateral recesses and foramina, left more than right. There is considerable potential for compression of the left L4 nerve root in the foramen.  L5-S1: There is mild bulging of the disc. There is bilateral facet degeneration and hypertrophy left more than right. No central canal stenosis. Foraminal narrowing on the left could possibly affect the exiting L5 nerve root.  Compared to the study of 2014, no change is appreciated.  IMPRESSION: Spinal curvature convex to the left with the apex at L2-3. Right lateral recess and foraminal narrowing at L2-3 and L3-4 could cause right-sided neural compression. Left foraminal stenosis at L4-5 is quite severe and could affect the exiting L4 nerve root, though left-sided  pain is not described.   Electronically Signed By: Nelson Chimes M.D. On: 04/15/2014 17:16  IMPRESSION Patient has lumbar radiculopathy on the right lower extremity due to severe scoliosis with nerve root compression most likely affecting the right L2 and right L3 nerve roots.  Completed Orders (this encounter) Order Details Reason Side Interpretation Result Initial Treatment Date Region  Lifestyle education regarding diet Encouraged to eat a well balanced diet and follow up with primary care physician.        Hypertension education Follow up with primary care physician.         Assessment/Plan # Detail Type Description   1. Assessment Scoliosis (and kyphoscoliosis), idiopathic (M41.20).       2. Assessment Radiculopathy, lumbar region (M54.16).       3. Assessment DDD (degenerative disc disease), lumbar (M51.36).       4. Assessment Low back pain, unspecified back pain laterality, with sciatica presence unspecified (M54.5).       5. Assessment Body mass index (BMI) 30.0-30.9, adult (Z68.30).   Plan Orders Today's instructions / counseling include(s) Lifestyle education regarding diet.       6. Assessment Essential (primary) hypertension (I10).         Pain Assessment/Treatment Pain Scale: 8/10. Method: Numeric Pain Intensity Scale. Location: back. Onset: 12/11/2013. Duration: varies. Quality: discomforting. Pain Assessment/Treatment follow-up plan of care: Patient is taking medications as prescribed..  Fall Risk Plan The patient has not fallen in the last year.  October Harkins is going to proceed with injection therapy.  He will work with her for the time being.  She will follow up with me after that depending on how she does with the injections.  Orders: Diagnostic Procedures: Assessment Procedure  M54.16 TFESI - right - L2 - L3  Instruction(s)/Education: Assessment Instruction  I10 Hypertension education  Z68.30 Lifestyle education regarding diet              Provider:  Marchia Meiers. Vertell Limber MD  04/30/2014 01:30 PM Dictation edited by: Marchia Meiers. Vertell Limber    CC Providers: Erline Levine MD [address] [place], [place] [address]              Electronically signed by Marchia Meiers. Vertell Limber MD on 04/30/2014 01:30 PM

## 2015-11-13 NOTE — Progress Notes (Signed)

## 2015-11-13 NOTE — Brief Op Note (Signed)
11/13/2015  1:26 PM  PATIENT:  Ebony Foley  75 y.o. female  PRE-OPERATIVE DIAGNOSIS:  Low back pain, scoliosis, lumbar radiculopathy  POST-OPERATIVE DIAGNOSIS: Low back pain, scoliosis, lumbar radiculopathy   PROCEDURE:  Procedure(s) with comments: Lumbar Spinal Cord Stimulator Insertion with Paddle Lead Placement, Thoracic Laminectomy and Implantable Pulse Generator Placement (N/A) - LUMBAR SPINAL CORD STIMULATOR INSERTION WITH PADDLE LEAD PLACEMENT  SURGEON:  Surgeon(s) and Role:    * Erline Levine, MD - Primary  PHYSICIAN ASSISTANT:   ASSISTANTS: Poteat, RN   ANESTHESIA:   general  EBL:  Total I/O In: 1300 [I.V.:1300] Out: 25 [Blood:25]  BLOOD ADMINISTERED:none  DRAINS: none   LOCAL MEDICATIONS USED:  LIDOCAINE   SPECIMEN:  No Specimen  DISPOSITION OF SPECIMEN:  N/A  COUNTS:  YES  TOURNIQUET:  * No tourniquets in log *  DICTATION: DICTATION: Patient is 75 year old woman with severe scoliosis who has chronic pain. Dr. Maryjean Foley attempted to pass percutaneous leads, but was unable to do so without great difficulty.  Therefore, the presents for laminectomy and placement of spinal cord stimulator.  Procedure: Following smooth and uncomplicated induction of general endotracheal anesthesia the patient was placed in a prone position on chest rolls. C-arm was used to mark the T9 level. Her left flank was also marked for placement of implantable pulse generator. His back was then prepped and draped in the usual sterile fashion with Betadine scrub and Duraprep. Area of planned incision was infiltrated with local lidocaine. An incision was made carried to the thoracic fascia which was incised on the left side of midline and a subperiosteal dissection was performed exposing the T9 10 interlaminar space. After confirming correct orientation with C-arm a thoracic laminectomy was performed a high-speed drill and Kerrison rongeurs exposing the spinal cord dura. A 2 x8 [employer]  paddle electrode was then inserted in the epidural space and was found to be well positioned within the midline up to the level of the T 7 / T 8 interspace. Anchors were then placed in the fascia was closed. A subcutaneous pocket was created to the left of midline. The tunneler was used to create a subcutaneous tract for the electrodes and these were then passed, anchored to the implantable pulse generator and connections were torqued appropriately. Redundant electrode was circularized beneath the Precision Spectra IPG and a strain release loop was placed in the laminectomy incision site. The wounds were irrigated and closed with 2-0 and 3-0 Vicryl stitches and the wounds were dressed with Dermabond. Impedances were assessed and found to be correct. Final radiograph demonstrated that the lead was in the midline from the T 7 /T 8 disc space to the superior aspect of T 9.  Patient was extubated and was taken to recovery having tolerated procedure well counts were correct at the end of the case.  PLAN OF CARE: Admit for overnight observation  PATIENT DISPOSITION:  PACU - hemodynamically stable.   Delay start of Pharmacological VTE agent (>24hrs) due to surgical blood loss or risk of bleeding: yes

## 2015-11-13 NOTE — Interval H&P Note (Signed)
History and Physical Interval Note:  11/13/2015 7:51 AM  Ebony Foley  has presented today for surgery, with the diagnosis of Low back pain  The various methods of treatment have been discussed with the patient and family. After consideration of risks, benefits and other options for treatment, the patient has consented to  Procedure(s) with comments: [place] (N/A) - [place] as a surgical intervention .  The patient's history has been reviewed, patient examined, no change in status, stable for surgery.  I have reviewed the patient's chart and labs.  Questions were answered to the patient's satisfaction.     [name] D

## 2015-11-13 NOTE — Anesthesia Procedure Notes (Signed)
Procedure Name: Intubation Date/Time: 11/13/2015 11:56 AM Performed by: Judeth Cornfield T Pre-anesthesia Checklist: Patient identified, Emergency Drugs available, Suction available and Patient being monitored Patient Re-evaluated:Patient Re-evaluated prior to inductionOxygen Delivery Method: Circle System Utilized Preoxygenation: Pre-oxygenation with 100% oxygen Intubation Type: IV induction Ventilation: Mask ventilation without difficulty Laryngoscope Size: Mac and 3 Grade View: Grade I Tube type: Oral Tube size: 7.5 mm Number of attempts: 1 Airway Equipment and Method: Stylet Placement Confirmation: ETT inserted through vocal cords under direct vision,  positive ETCO2 and breath sounds checked- equal and bilateral Secured at: 22 cm Tube secured with: Tape Dental Injury: Teeth and Oropharynx as per pre-operative assessment

## 2015-11-13 NOTE — Op Note (Signed)
11/13/2015  1:26 PM  PATIENT:  Ebony Foley  75 y.o. female  PRE-OPERATIVE DIAGNOSIS:  Low back pain, scoliosis, lumbar radiculopathy  POST-OPERATIVE DIAGNOSIS: Low back pain, scoliosis, lumbar radiculopathy   PROCEDURE:  Procedure(s) with comments: Lumbar Spinal Cord Stimulator Insertion with Paddle Lead Placement, Thoracic Laminectomy and Implantable Pulse Generator Placement (N/A) - LUMBAR SPINAL CORD STIMULATOR INSERTION WITH PADDLE LEAD PLACEMENT  SURGEON:  Surgeon(s) and Role:    * Erline Levine, MD - Primary  PHYSICIAN ASSISTANT:   ASSISTANTS: Poteat, RN   ANESTHESIA:   general  EBL:  Total I/O In: 1300 [I.V.:1300] Out: 25 [Blood:25]  BLOOD ADMINISTERED:none  DRAINS: none   LOCAL MEDICATIONS USED:  LIDOCAINE   SPECIMEN:  No Specimen  DISPOSITION OF SPECIMEN:  N/A  COUNTS:  YES  TOURNIQUET:  * No tourniquets in log *  DICTATION: DICTATION: Patient is 74 year old woman with severe scoliosis who has chronic pain. Dr. Maryjean Foley attempted to pass percutaneous leads, but was unable to do so without great difficulty.  Therefore, the presents for laminectomy and placement of spinal cord stimulator.  Procedure: Following smooth and uncomplicated induction of general endotracheal anesthesia the patient was placed in a prone position on chest rolls. C-arm was used to mark the T9 level. Her left flank was also marked for placement of implantable pulse generator. His back was then prepped and draped in the usual sterile fashion with Betadine scrub and Duraprep. Area of planned incision was infiltrated with local lidocaine. An incision was made carried to the thoracic fascia which was incised on the left side of midline and a subperiosteal dissection was performed exposing the T9 10 interlaminar space. After confirming correct orientation with C-arm a thoracic laminectomy was performed a high-speed drill and Kerrison rongeurs exposing the spinal cord dura. A 2 x8 [employer]  paddle electrode was then inserted in the epidural space and was found to be well positioned within the midline up to the level of the T 7 / T 8 interspace. Anchors were then placed in the fascia was closed. A subcutaneous pocket was created to the left of midline. The tunneler was used to create a subcutaneous tract for the electrodes and these were then passed, anchored to the implantable pulse generator and connections were torqued appropriately. Redundant electrode was circularized beneath the [employer] IPG and a strain release loop was placed in the laminectomy incision site. The wounds were irrigated and closed with 2-0 and 3-0 Vicryl stitches and the wounds were dressed with Dermabond. Impedances were assessed and found to be correct. Final radiograph demonstrated that the lead was in the midline from the T 7 /T 8 disc space to the superior aspect of T 9.  Patient was extubated and was taken to recovery having tolerated procedure well counts were correct at the end of the case.  PLAN OF CARE: Admit for overnight observation  PATIENT DISPOSITION:  PACU - hemodynamically stable.   Delay start of Pharmacological VTE agent (>24hrs) due to surgical blood loss or risk of bleeding: yes

## 2015-11-13 NOTE — Anesthesia Postprocedure Evaluation (Signed)
Anesthesia Post Note  Patient: Ebony Foley  Procedure(s) Performed: Procedure(s) (LRB): Lumbar Spinal Cord Stimulator Insertion with Paddle Lead Placement, Thoracic Laminectomy and Implantable Pulse Generator Placement (N/A)  Patient location during evaluation: PACU Anesthesia Type: General Level of consciousness: awake, awake and alert and oriented Pain management: pain level controlled Vital Signs Assessment: post-procedure vital signs reviewed and stable Respiratory status: spontaneous breathing, nonlabored ventilation and respiratory function stable Cardiovascular status: blood pressure returned to baseline Postop Assessment: no headache Anesthetic complications: no    Last Vitals:  Vitals:   11/13/15 1519 11/13/15 1550  BP:  132/61  Pulse: 77 71  Resp: 13 18  Temp: 36.3 C 36.4 C    Last Pain:  Vitals:   11/13/15 1520  PainSc: 0-No pain                 Versie Soave COKER

## 2015-11-13 NOTE — Transfer of Care (Signed)
Immediate Anesthesia Transfer of Care Note  Patient: Ebony Foley  Procedure(s) Performed: Procedure(s) with comments: Lumbar Spinal Cord Stimulator Insertion with Paddle Lead Placement, Thoracic Laminectomy and Implantable Pulse Generator Placement (N/A) - LUMBAR SPINAL CORD STIMULATOR INSERTION WITH PADDLE LEAD PLACEMENT  Patient Location: PACU  Anesthesia Type:General  Level of Consciousness: patient cooperative and responds to stimulation  Airway & Oxygen Therapy: Patient Spontanous Breathing and Patient connected to nasal cannula oxygen  Post-op Assessment: Report given to RN and Post -op Vital signs reviewed and stable  Post vital signs: Reviewed and stable  Last Vitals:  Vitals:   11/13/15 0757  BP: (!) 151/65  Pulse: 62  Resp: 20  Temp: 36.8 C    Last Pain: There were no vitals filed for this visit.    Patients Stated Pain Goal: 0 ([id number])  Complications: No apparent anesthesia complications

## 2015-11-13 NOTE — Anesthesia Preprocedure Evaluation (Signed)
Anesthesia Evaluation  Patient identified by MRN, date of birth, ID band Patient awake    Reviewed: Allergy & Precautions, NPO status , Patient's Chart, lab work & pertinent test results  Airway Mallampati: II  TM Distance: >3 FB Neck ROM: Full    Dental  (+) Edentulous Upper, Partial Lower, Dental Advisory Given   Pulmonary former smoker,    breath sounds clear to auscultation       Cardiovascular hypertension,  Rhythm:Regular     Neuro/Psych    GI/Hepatic   Endo/Other  diabetes  Renal/GU      Musculoskeletal   Abdominal   Peds  Hematology   Anesthesia Other Findings   Reproductive/Obstetrics                             Anesthesia Physical Anesthesia Plan  ASA: III  Anesthesia Plan: General   Post-op Pain Management:    Induction: Intravenous  Airway Management Planned: Oral ETT  Additional Equipment:   Intra-op Plan:   Post-operative Plan: Extubation in OR  Informed Consent: I have reviewed the patients History and Physical, chart, labs and discussed the procedure including the risks, benefits and alternatives for the proposed anesthesia with the patient or authorized representative who has indicated his/her understanding and acceptance.   Dental advisory given  Plan Discussed with: CRNA and Anesthesiologist  Anesthesia Plan Comments:         Anesthesia Quick Evaluation

## 2015-11-13 NOTE — Progress Notes (Signed)
Pt doing well. Pt and husband given D/C instructions with verbal understanding. Pt's incision is clean and dry with no sign of infection. Pt's IV was removed prior to D/C. Pt D/C'd home via wheelchair @ 1745 per MD order. Pt is stable @ D/C and has no other needs at this time. Holli Humbles, RN

## 2015-11-13 NOTE — Progress Notes (Signed)
Awake, alert, conversant.  MAEW with good power.  Doing well.  

## 2015-11-13 NOTE — Progress Notes (Signed)
PHARMACIST - PHYSICIAN ORDER COMMUNICATION  CONCERNING: P&T Medication Policy on Herbal Medications  DESCRIPTION:  This patient's order for:  Ebony Foley  has been noted.  This product(s) is classified as an "herbal" or natural product. Due to a lack of definitive safety studies or FDA approval, nonstandard manufacturing practices, plus the potential risk of unknown drug-drug interactions while on inpatient medications, the Pharmacy and Therapeutics Committee does not permit the use of "herbal" or natural products of this type within [facility].   ACTION TAKEN: The pharmacy department is unable to verify this order at this time and your patient has been informed of this safety policy. Please reevaluate patient's clinical condition at discharge and address if the herbal or natural product(s) should be resumed at that time.  Reatha Harps, Pharm.D., BCPS Clinical Pharmacist

## 2015-11-14 ENCOUNTER — Encounter (HOSPITAL_COMMUNITY): Payer: Self-pay | Admitting: Neurosurgery

## 2015-11-15 NOTE — Discharge Summary (Signed)
[]  Hide copied text Physician Discharge Summary  Patient ID: Ebony Foley MRN: [id number] DOB/AGE: 1940/06/12 75 y.o.  Admit date: 11/13/2015 Discharge date: 11/13/2015  Admission Diagnoses: Low back pain, scoliosis, lumbar radiculopathy   Discharge Diagnoses: Low back pain, scoliosis, lumbar radiculopathy Lumbar Spinal Cord Stimulator Insertion with Paddle Lead Placement, Thoracic Laminectomy and Implantable Pulse Generator Placement (N/A) - LUMBAR SPINAL CORD STIMULATOR INSERTION WITH PADDLE LEAD PLACEMENT  Active Problems:   * No active hospital problems. *   Discharged Condition: good  Hospital Course: Ebony Foley was admitted for placement of spinal cord stimulator system. Following uncomplicated placement of thoracic paddle leads, extension , and implantable pulse generator, she recovered nicely. She will be discharged to home.   Consults: None  Significant Diagnostic Studies: radiology: X-Ray: intra-op  Treatments: surgery: Lumbar Spinal Cord Stimulator Insertion with Paddle Lead Placement, Thoracic Laminectomy and Implantable Pulse Generator Placement (N/A) - LUMBAR SPINAL CORD STIMULATOR INSERTION WITH PADDLE LEAD PLACEMENT    Discharge Exam: Blood pressure 130/65, pulse 82, temperature 97.6 F (36.4 C), resp. rate 20, height 5\' 4"  (1.626 m), weight 80.3 kg (177 lb), SpO2 96 %. Alert, conversant, MAEW. incisions without erythema, swelling, or drainage beneath honeycomb & dermabond.   Disposition: Public librarian will visit this afternoon to program stimulator settings. Discharge to home. Pt already has pain medications at home.  She will call office to schedule 3-4 week f/u with Dr. Vertell Limber.          Signed: Verdis Prime 11/13/2015, 1:37 PM

## 2015-11-26 DIAGNOSIS — H35373 Puckering of macula, bilateral: Secondary | ICD-10-CM | POA: Diagnosis not present

## 2015-11-26 DIAGNOSIS — H2513 Age-related nuclear cataract, bilateral: Secondary | ICD-10-CM | POA: Diagnosis not present

## 2015-12-06 DIAGNOSIS — M7989 Other specified soft tissue disorders: Secondary | ICD-10-CM | POA: Diagnosis not present

## 2015-12-06 DIAGNOSIS — R7303 Prediabetes: Secondary | ICD-10-CM | POA: Diagnosis not present

## 2015-12-06 DIAGNOSIS — M25561 Pain in right knee: Secondary | ICD-10-CM | POA: Diagnosis not present

## 2015-12-06 DIAGNOSIS — Z79899 Other long term (current) drug therapy: Secondary | ICD-10-CM | POA: Diagnosis not present

## 2015-12-06 DIAGNOSIS — T887XXA Unspecified adverse effect of drug or medicament, initial encounter: Secondary | ICD-10-CM | POA: Diagnosis not present

## 2015-12-06 DIAGNOSIS — Z23 Encounter for immunization: Secondary | ICD-10-CM | POA: Diagnosis not present

## 2015-12-13 DIAGNOSIS — Z6830 Body mass index (BMI) 30.0-30.9, adult: Secondary | ICD-10-CM | POA: Diagnosis not present

## 2015-12-13 DIAGNOSIS — I10 Essential (primary) hypertension: Secondary | ICD-10-CM | POA: Diagnosis not present

## 2015-12-19 DIAGNOSIS — H35372 Puckering of macula, left eye: Secondary | ICD-10-CM | POA: Diagnosis not present

## 2015-12-19 DIAGNOSIS — H2513 Age-related nuclear cataract, bilateral: Secondary | ICD-10-CM | POA: Diagnosis not present

## 2016-01-08 DIAGNOSIS — L570 Actinic keratosis: Secondary | ICD-10-CM | POA: Diagnosis not present

## 2016-01-08 DIAGNOSIS — B078 Other viral warts: Secondary | ICD-10-CM | POA: Diagnosis not present

## 2016-01-08 DIAGNOSIS — Z1283 Encounter for screening for malignant neoplasm of skin: Secondary | ICD-10-CM | POA: Diagnosis not present

## 2016-01-08 DIAGNOSIS — L820 Inflamed seborrheic keratosis: Secondary | ICD-10-CM | POA: Diagnosis not present

## 2016-01-08 DIAGNOSIS — Z85820 Personal history of malignant melanoma of skin: Secondary | ICD-10-CM | POA: Diagnosis not present

## 2016-01-24 DIAGNOSIS — Z0000 Encounter for general adult medical examination without abnormal findings: Secondary | ICD-10-CM | POA: Diagnosis not present

## 2016-01-24 DIAGNOSIS — I2510 Atherosclerotic heart disease of native coronary artery without angina pectoris: Secondary | ICD-10-CM | POA: Diagnosis not present

## 2016-01-24 DIAGNOSIS — R7303 Prediabetes: Secondary | ICD-10-CM | POA: Diagnosis not present

## 2016-01-24 DIAGNOSIS — I10 Essential (primary) hypertension: Secondary | ICD-10-CM | POA: Diagnosis not present

## 2016-01-24 DIAGNOSIS — Z79899 Other long term (current) drug therapy: Secondary | ICD-10-CM | POA: Diagnosis not present

## 2016-01-28 DIAGNOSIS — H35373 Puckering of macula, bilateral: Secondary | ICD-10-CM | POA: Diagnosis not present

## 2016-01-28 DIAGNOSIS — H2513 Age-related nuclear cataract, bilateral: Secondary | ICD-10-CM | POA: Diagnosis not present

## 2016-01-28 DIAGNOSIS — H43813 Vitreous degeneration, bilateral: Secondary | ICD-10-CM | POA: Diagnosis not present

## 2016-01-30 DIAGNOSIS — I2510 Atherosclerotic heart disease of native coronary artery without angina pectoris: Secondary | ICD-10-CM | POA: Diagnosis not present

## 2016-01-30 DIAGNOSIS — Z79899 Other long term (current) drug therapy: Secondary | ICD-10-CM | POA: Diagnosis not present

## 2016-01-30 DIAGNOSIS — R7303 Prediabetes: Secondary | ICD-10-CM | POA: Diagnosis not present

## 2016-01-30 DIAGNOSIS — I10 Essential (primary) hypertension: Secondary | ICD-10-CM | POA: Diagnosis not present

## 2016-01-30 DIAGNOSIS — R8299 Other abnormal findings in urine: Secondary | ICD-10-CM | POA: Diagnosis not present

## 2016-02-28 DIAGNOSIS — K134 Granuloma and granuloma-like lesions of oral mucosa: Secondary | ICD-10-CM | POA: Diagnosis not present

## 2016-02-28 DIAGNOSIS — S6980XA Other specified injuries of unspecified wrist, hand and finger(s), initial encounter: Secondary | ICD-10-CM | POA: Diagnosis not present

## 2016-02-28 DIAGNOSIS — Z0131 Encounter for examination of blood pressure with abnormal findings: Secondary | ICD-10-CM | POA: Diagnosis not present

## 2016-02-28 DIAGNOSIS — L980 Pyogenic granuloma: Secondary | ICD-10-CM | POA: Diagnosis not present

## 2016-03-19 DIAGNOSIS — M25551 Pain in right hip: Secondary | ICD-10-CM | POA: Diagnosis not present

## 2016-03-19 DIAGNOSIS — M4120 Other idiopathic scoliosis, site unspecified: Secondary | ICD-10-CM | POA: Diagnosis not present

## 2016-04-08 DIAGNOSIS — B078 Other viral warts: Secondary | ICD-10-CM | POA: Diagnosis not present

## 2016-04-08 DIAGNOSIS — Z1283 Encounter for screening for malignant neoplasm of skin: Secondary | ICD-10-CM | POA: Diagnosis not present

## 2016-04-08 DIAGNOSIS — Z85820 Personal history of malignant melanoma of skin: Secondary | ICD-10-CM | POA: Diagnosis not present

## 2016-04-08 DIAGNOSIS — L409 Psoriasis, unspecified: Secondary | ICD-10-CM | POA: Diagnosis not present

## 2016-04-21 DIAGNOSIS — Z1231 Encounter for screening mammogram for malignant neoplasm of breast: Secondary | ICD-10-CM | POA: Diagnosis not present

## 2016-06-10 DIAGNOSIS — R072 Precordial pain: Secondary | ICD-10-CM | POA: Diagnosis not present

## 2016-06-10 DIAGNOSIS — I25119 Atherosclerotic heart disease of native coronary artery with unspecified angina pectoris: Secondary | ICD-10-CM | POA: Diagnosis not present

## 2016-06-10 DIAGNOSIS — R7303 Prediabetes: Secondary | ICD-10-CM | POA: Diagnosis not present

## 2016-06-10 DIAGNOSIS — E785 Hyperlipidemia, unspecified: Secondary | ICD-10-CM | POA: Diagnosis not present

## 2016-06-10 DIAGNOSIS — I10 Essential (primary) hypertension: Secondary | ICD-10-CM | POA: Diagnosis not present

## 2016-06-10 DIAGNOSIS — Z955 Presence of coronary angioplasty implant and graft: Secondary | ICD-10-CM | POA: Diagnosis not present

## 2016-06-11 DIAGNOSIS — R072 Precordial pain: Secondary | ICD-10-CM | POA: Diagnosis not present

## 2016-06-11 DIAGNOSIS — I25119 Atherosclerotic heart disease of native coronary artery with unspecified angina pectoris: Secondary | ICD-10-CM | POA: Diagnosis not present

## 2016-06-11 DIAGNOSIS — Z955 Presence of coronary angioplasty implant and graft: Secondary | ICD-10-CM | POA: Diagnosis not present

## 2016-07-09 DIAGNOSIS — L409 Psoriasis, unspecified: Secondary | ICD-10-CM | POA: Diagnosis not present

## 2016-07-09 DIAGNOSIS — L820 Inflamed seborrheic keratosis: Secondary | ICD-10-CM | POA: Diagnosis not present

## 2016-07-09 DIAGNOSIS — B078 Other viral warts: Secondary | ICD-10-CM | POA: Diagnosis not present

## 2016-07-09 DIAGNOSIS — Z85820 Personal history of malignant melanoma of skin: Secondary | ICD-10-CM | POA: Diagnosis not present

## 2016-07-09 DIAGNOSIS — Z1283 Encounter for screening for malignant neoplasm of skin: Secondary | ICD-10-CM | POA: Diagnosis not present

## 2016-07-18 DIAGNOSIS — R7303 Prediabetes: Secondary | ICD-10-CM | POA: Diagnosis not present

## 2016-07-23 DIAGNOSIS — Z79899 Other long term (current) drug therapy: Secondary | ICD-10-CM | POA: Diagnosis not present

## 2016-07-23 DIAGNOSIS — R7303 Prediabetes: Secondary | ICD-10-CM | POA: Diagnosis not present

## 2016-07-23 DIAGNOSIS — E785 Hyperlipidemia, unspecified: Secondary | ICD-10-CM | POA: Diagnosis not present

## 2016-07-23 DIAGNOSIS — I2510 Atherosclerotic heart disease of native coronary artery without angina pectoris: Secondary | ICD-10-CM | POA: Diagnosis not present

## 2016-12-11 DIAGNOSIS — Z23 Encounter for immunization: Secondary | ICD-10-CM | POA: Diagnosis not present

## 2017-01-08 DIAGNOSIS — Z85820 Personal history of malignant melanoma of skin: Secondary | ICD-10-CM | POA: Diagnosis not present

## 2017-01-08 DIAGNOSIS — Z1283 Encounter for screening for malignant neoplasm of skin: Secondary | ICD-10-CM | POA: Diagnosis not present

## 2017-01-08 DIAGNOSIS — L820 Inflamed seborrheic keratosis: Secondary | ICD-10-CM | POA: Diagnosis not present

## 2017-01-20 DIAGNOSIS — C439 Malignant melanoma of skin, unspecified: Secondary | ICD-10-CM | POA: Diagnosis not present

## 2017-01-20 DIAGNOSIS — Z85820 Personal history of malignant melanoma of skin: Secondary | ICD-10-CM | POA: Diagnosis not present

## 2017-01-28 DIAGNOSIS — Z79899 Other long term (current) drug therapy: Secondary | ICD-10-CM | POA: Diagnosis not present

## 2017-01-28 DIAGNOSIS — R7303 Prediabetes: Secondary | ICD-10-CM | POA: Diagnosis not present

## 2017-01-28 DIAGNOSIS — Z0000 Encounter for general adult medical examination without abnormal findings: Secondary | ICD-10-CM | POA: Diagnosis not present

## 2017-01-28 DIAGNOSIS — I2510 Atherosclerotic heart disease of native coronary artery without angina pectoris: Secondary | ICD-10-CM | POA: Diagnosis not present

## 2017-01-28 DIAGNOSIS — I25119 Atherosclerotic heart disease of native coronary artery with unspecified angina pectoris: Secondary | ICD-10-CM | POA: Diagnosis not present

## 2017-01-28 DIAGNOSIS — D509 Iron deficiency anemia, unspecified: Secondary | ICD-10-CM | POA: Diagnosis not present

## 2017-01-28 DIAGNOSIS — E785 Hyperlipidemia, unspecified: Secondary | ICD-10-CM | POA: Diagnosis not present

## 2017-01-28 DIAGNOSIS — I10 Essential (primary) hypertension: Secondary | ICD-10-CM | POA: Diagnosis not present

## 2017-02-27 DIAGNOSIS — I10 Essential (primary) hypertension: Secondary | ICD-10-CM | POA: Diagnosis not present

## 2017-02-27 DIAGNOSIS — I2510 Atherosclerotic heart disease of native coronary artery without angina pectoris: Secondary | ICD-10-CM | POA: Diagnosis not present

## 2017-02-27 DIAGNOSIS — R7303 Prediabetes: Secondary | ICD-10-CM | POA: Diagnosis not present

## 2017-02-27 DIAGNOSIS — Z79899 Other long term (current) drug therapy: Secondary | ICD-10-CM | POA: Diagnosis not present

## 2017-02-27 DIAGNOSIS — D509 Iron deficiency anemia, unspecified: Secondary | ICD-10-CM | POA: Diagnosis not present

## 2017-02-27 DIAGNOSIS — R82998 Other abnormal findings in urine: Secondary | ICD-10-CM | POA: Diagnosis not present

## 2017-02-27 DIAGNOSIS — E785 Hyperlipidemia, unspecified: Secondary | ICD-10-CM | POA: Diagnosis not present

## 2017-02-27 DIAGNOSIS — I25119 Atherosclerotic heart disease of native coronary artery with unspecified angina pectoris: Secondary | ICD-10-CM | POA: Diagnosis not present

## 2017-07-30 IMAGING — RF DG THORACIC SPINE 1V
1 series · 1 of 1 positions shown · non-contrast
Comparison: None.

CLINICAL DATA: Lumbar spinal cord stimulator insertion for lower
back pain.

EXAM:
OPERATIVE THORACIC SPINE 1 VIEW(S)

[Series 1: run · 1 of 1 slices shown]
[im 1/1]
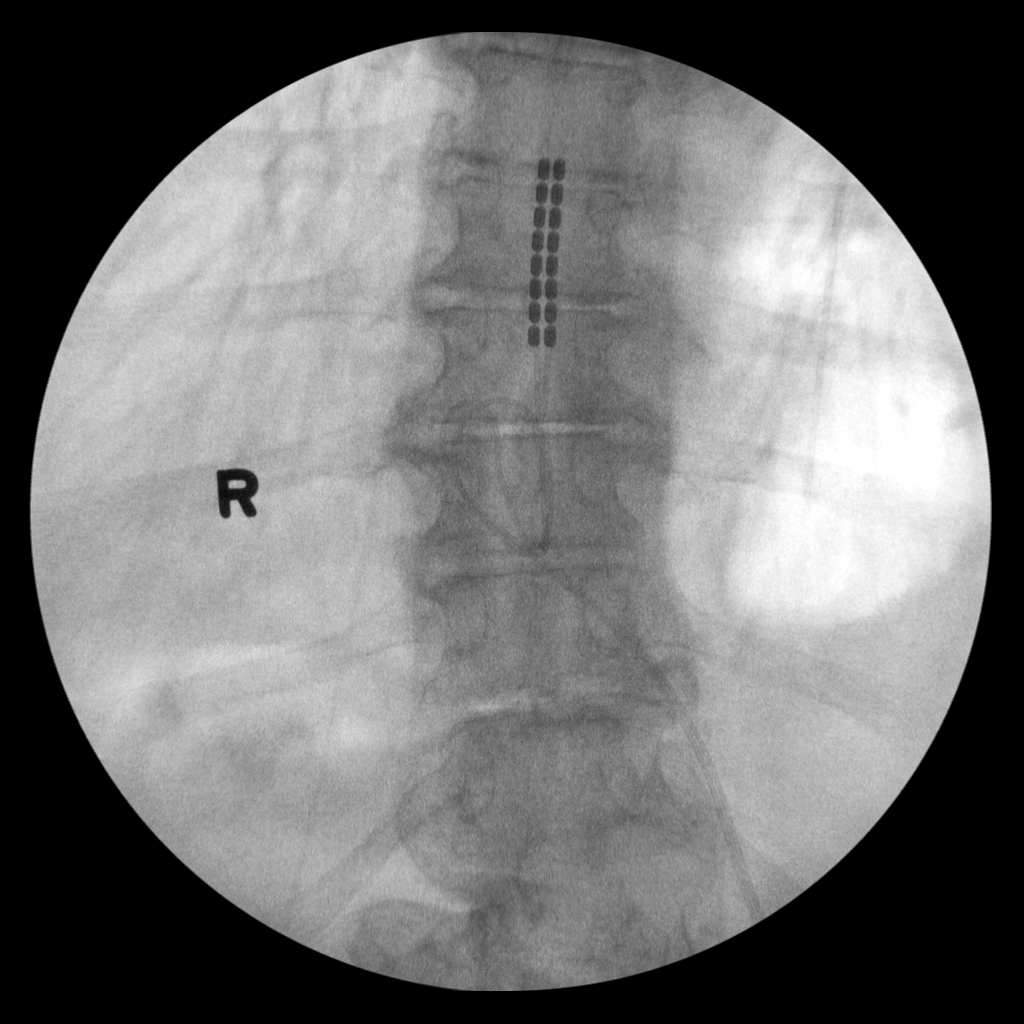

[1 of 1 positions shown; findings below may reference images not displayed]

FINDINGS: Single AP view of the thoracic spine is provided showing a spinal
stimulator in the midline.
IMPRESSION: Single intraoperative fluoroscopic spot image showing a spinal
stimulator within the midline of the thoracic spine.
